# Patient Record
Sex: Female | Born: 1955 | Race: White | Hispanic: No | Marital: Married | Smoking: Never smoker
Health system: Southern US, Community
[De-identification: ages and names within clinical notes are randomized; demographics above are authoritative.]

## PROBLEM LIST (undated history)

## (undated) DIAGNOSIS — R5383 Other fatigue: Secondary | ICD-10-CM

## (undated) DIAGNOSIS — M5020 Other cervical disc displacement, unspecified cervical region: Secondary | ICD-10-CM

## (undated) DIAGNOSIS — D509 Iron deficiency anemia, unspecified: Secondary | ICD-10-CM

## (undated) DIAGNOSIS — E538 Deficiency of other specified B group vitamins: Secondary | ICD-10-CM

## (undated) DIAGNOSIS — G8929 Other chronic pain: Secondary | ICD-10-CM

## (undated) DIAGNOSIS — A6000 Herpesviral infection of urogenital system, unspecified: Secondary | ICD-10-CM

## (undated) DIAGNOSIS — R55 Syncope and collapse: Secondary | ICD-10-CM

## (undated) DIAGNOSIS — J3081 Allergic rhinitis due to animal (cat) (dog) hair and dander: Secondary | ICD-10-CM

## (undated) DIAGNOSIS — K319 Disease of stomach and duodenum, unspecified: Secondary | ICD-10-CM

## (undated) DIAGNOSIS — M2550 Pain in unspecified joint: Secondary | ICD-10-CM

## (undated) DIAGNOSIS — I341 Nonrheumatic mitral (valve) prolapse: Secondary | ICD-10-CM

## (undated) HISTORY — DX: Allergic rhinitis due to animal (cat) (dog) hair and dander: J30.81

## (undated) HISTORY — PX: TONSILLECTOMY AND ADENOIDECTOMY: SUR1326

## (undated) HISTORY — DX: Iron deficiency anemia, unspecified: D50.9

## (undated) HISTORY — DX: Disease of stomach and duodenum, unspecified: K31.9

## (undated) HISTORY — DX: Other chronic pain: G89.29

## (undated) HISTORY — DX: Deficiency of other specified B group vitamins: E53.8

## (undated) HISTORY — DX: Nonrheumatic mitral (valve) prolapse: I34.1

## (undated) HISTORY — DX: Other cervical disc displacement, unspecified cervical region: M50.20

## (undated) HISTORY — DX: Other fatigue: R53.83

## (undated) HISTORY — DX: Herpesviral infection of urogenital system, unspecified: A60.00

## (undated) HISTORY — DX: Syncope and collapse: R55

## (undated) HISTORY — PX: OTHER SURGICAL HISTORY: SHX169

## (undated) HISTORY — DX: Pain in unspecified joint: M25.50

---

## 1990-02-21 HISTORY — PX: ABDOMINAL HYSTERECTOMY: SHX81

## 2008-09-16 ENCOUNTER — Encounter: Admission: RE | Admit: 2008-09-16 | Discharge: 2008-09-16 | Payer: Self-pay | Admitting: Neurology

## 2008-10-23 ENCOUNTER — Ambulatory Visit: Payer: Self-pay | Admitting: Obstetrics & Gynecology

## 2009-01-28 ENCOUNTER — Ambulatory Visit: Payer: Self-pay | Admitting: Internal Medicine

## 2009-04-17 DIAGNOSIS — R55 Syncope and collapse: Secondary | ICD-10-CM

## 2009-04-21 ENCOUNTER — Ambulatory Visit: Payer: Self-pay | Admitting: Cardiovascular Disease

## 2009-04-21 DIAGNOSIS — I059 Rheumatic mitral valve disease, unspecified: Secondary | ICD-10-CM | POA: Insufficient documentation

## 2009-04-27 ENCOUNTER — Encounter: Payer: Self-pay | Admitting: Cardiovascular Disease

## 2009-04-28 ENCOUNTER — Ambulatory Visit: Payer: Self-pay

## 2009-05-05 ENCOUNTER — Telehealth: Payer: Self-pay | Admitting: Cardiovascular Disease

## 2009-07-15 ENCOUNTER — Ambulatory Visit: Payer: Self-pay | Admitting: Internal Medicine

## 2010-03-23 NOTE — Progress Notes (Signed)
Summary: PHI  PHI   Imported By: Harlon Flor 04/23/2009 08:57:41  _____________________________________________________________________  External Attachment:    Type:   Image     Comment:   External Document

## 2010-03-23 NOTE — Procedures (Signed)
Summary: [facility]   Imported By: Harlon Flor 05/12/2009 11:31:10  _____________________________________________________________________  External Attachment:    Type:   Image     Comment:   External Document

## 2010-03-23 NOTE — Progress Notes (Signed)
Summary: [facility]  Phone Note Outgoing Call   Call placed by: Cala Bradford  Call placed to: Patient Summary of Call: called to pt to follow up on monitor pt has sent monitor back to [facility] due to a death in the family. she couldn' t handle everything going on. she said that she has felt ok with no synocope.  pt said that she would follow up with [place] at her apppointment.  I let pt know if she needed [place] to please call.   Initial call taken by: Mercer Pod,  May 05, 2009 9:01 AM
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
## 2010-04-19 ENCOUNTER — Ambulatory Visit: Payer: Self-pay | Admitting: Internal Medicine

## 2010-04-30 ENCOUNTER — Ambulatory Visit: Payer: Self-pay | Admitting: Internal Medicine

## 2010-05-25 ENCOUNTER — Ambulatory Visit: Payer: Self-pay | Admitting: Internal Medicine

## 2010-09-08 ENCOUNTER — Encounter: Payer: Self-pay | Admitting: Cardiovascular Disease

## 2010-09-28 ENCOUNTER — Encounter: Payer: Self-pay | Admitting: Internal Medicine

## 2010-10-04 ENCOUNTER — Encounter: Payer: Self-pay | Admitting: Internal Medicine

## 2010-11-22 ENCOUNTER — Ambulatory Visit (INDEPENDENT_AMBULATORY_CARE_PROVIDER_SITE_OTHER): Payer: BC Managed Care – PPO | Admitting: Internal Medicine

## 2010-11-22 ENCOUNTER — Encounter: Payer: Self-pay | Admitting: Internal Medicine

## 2010-11-22 VITALS — BP 112/71 | HR 58 | Temp 98.0°F | Resp 16 | Ht 62.0 in | Wt 118.0 lb

## 2010-11-22 DIAGNOSIS — J029 Acute pharyngitis, unspecified: Secondary | ICD-10-CM

## 2010-11-22 MED ORDER — AMOXICILLIN 500 MG PO CAPS: 500.0000 mg | ORAL_CAPSULE | Freq: Three times a day (TID) | ORAL | Status: AC

## 2010-11-22 NOTE — Patient Instructions (Signed)
Start antibiotics. Call if symptoms not improved within 48hr.

## 2010-11-22 NOTE — Progress Notes (Signed)
Subjective:    Patient ID: Lauren Whitaker, female    DOB: 05-Jun-1955, 55 y.o.   MRN: [id number]  Sore Throat  This is a new problem. The current episode started in the past 7 days. The problem has been unchanged. Neither side of throat is experiencing more pain than the other. There has been no fever. The pain is moderate. Associated symptoms include abdominal pain and swollen glands. Pertinent negatives include no congestion, coughing, diarrhea, drooling, ear pain, headaches, neck pain, shortness of breath, stridor, trouble swallowing or vomiting. She has had exposure to strep. She has tried acetaminophen and NSAIDs for the symptoms. The treatment provided no relief.     Outpatient Encounter Prescriptions as of 11/22/2010  Medication Sig Dispense Refill  . estradiol (ESTRACE) 2 MG tablet Take 2 mg by mouth daily.       . ferrous sulfate 325 (65 FE) MG EC tablet Take 325 mg by mouth 3 (three) times daily.        . predniSONE (DELTASONE) 5 MG tablet Take 5 mg by mouth daily.        . vitamin B-12 (CYANOCOBALAMIN) 1000 MCG tablet Take 1,000 mcg by mouth daily.          Review of Systems  Constitutional: Positive for chills and fatigue. Negative for fever.  HENT: Positive for sore throat. Negative for ear pain, congestion, drooling, trouble swallowing, neck pain and voice change.   Respiratory: Negative for cough, chest tightness, shortness of breath and stridor.   Cardiovascular: Negative for chest pain.  Gastrointestinal: Positive for abdominal pain. Negative for nausea, vomiting, diarrhea and constipation.  Musculoskeletal: Negative for arthralgias.  Skin: Negative for rash.  Neurological: Negative for headaches.   BP 112/71  Pulse 58  Temp(Src) 98 F (36.7 C) (Oral)  Resp 16  Ht 5\' 2"  (1.575 m)  Wt 118 lb (53.524 kg)  BMI 21.58 kg/m2  SpO2 100%     Objective:   Physical Exam  Constitutional: She is oriented to person, place, and time. She appears well-developed and  well-nourished. No distress.  HENT:  Head: Normocephalic and atraumatic.  Right Ear: Tympanic membrane, external ear and ear canal normal.  Left Ear: Tympanic membrane, external ear and ear canal normal.  Nose: Nose normal.  Mouth/Throat: Posterior oropharyngeal erythema present. No oropharyngeal exudate.  Eyes: Conjunctivae are normal. Pupils are equal, round, and reactive to light. Right eye exhibits no discharge. Left eye exhibits no discharge. No scleral icterus.  Neck: Normal range of motion. Neck supple. No tracheal deviation present. No thyromegaly present.  Cardiovascular: Normal rate, regular rhythm, normal heart sounds and intact distal pulses.  Exam reveals no gallop and no friction rub.   No murmur heard. Pulmonary/Chest: Effort normal and breath sounds normal. No respiratory distress. She has no wheezes. She has no rales. She exhibits no tenderness.  Musculoskeletal: Normal range of motion. She exhibits no edema and no tenderness.  Lymphadenopathy:    She has cervical adenopathy.       Right cervical: Superficial cervical adenopathy present.       Left cervical: Superficial cervical adenopathy present.  Neurological: She is alert and oriented to person, place, and time. No cranial nerve deficit. She exhibits normal muscle tone. Coordination normal.  Skin: Skin is warm and dry. No rash noted. She is not diaphoretic. No erythema. No pallor.  Psychiatric: She has a normal mood and affect. Her behavior is normal. Judgment and thought content normal.  Assessment & Plan:  1. Pharyngitis - History, symptoms and physical exam findings consistent with strep including: sore throat in absence of other symptoms such as cough/congestion, tender cervical LAD, abdominal pain, and exposure to strep.  Will treat empirically with Amoxicillin. Pt will call or return to clinic if symptoms do not improve.

## 2011-01-17 ENCOUNTER — Telehealth: Payer: Self-pay | Admitting: *Deleted

## 2011-01-17 NOTE — Telephone Encounter (Signed)
Pt left VM req antibiotics for "sinus infection". I spoke w/patient and advised her that she would need OV for antibiotics and scheduled for apt tomorrow am.

## 2011-01-18 ENCOUNTER — Ambulatory Visit: Payer: BC Managed Care – PPO | Admitting: Internal Medicine

## 2011-02-28 ENCOUNTER — Ambulatory Visit: Payer: BC Managed Care – PPO | Admitting: Internal Medicine

## 2011-03-07 ENCOUNTER — Encounter: Payer: Self-pay | Admitting: *Deleted

## 2011-03-07 ENCOUNTER — Encounter: Payer: Self-pay | Admitting: Internal Medicine

## 2011-03-07 ENCOUNTER — Ambulatory Visit (INDEPENDENT_AMBULATORY_CARE_PROVIDER_SITE_OTHER): Payer: BC Managed Care – PPO | Admitting: Internal Medicine

## 2011-03-07 VITALS — BP 118/76 | HR 76 | Temp 98.6°F

## 2011-03-07 DIAGNOSIS — Z111 Encounter for screening for respiratory tuberculosis: Secondary | ICD-10-CM

## 2011-03-07 DIAGNOSIS — N390 Urinary tract infection, site not specified: Secondary | ICD-10-CM

## 2011-03-07 LAB — POCT URINALYSIS DIPSTICK
Bilirubin, UA: NEGATIVE
Glucose, UA: NEGATIVE
Spec Grav, UA: 1.025

## 2011-03-07 MED ORDER — CIPROFLOXACIN HCL 500 MG PO TABS: 500.0000 mg | ORAL_TABLET | Freq: Two times a day (BID) | ORAL | Status: AC

## 2011-03-07 NOTE — Progress Notes (Signed)
Addended by: Melody Comas L on: 03/07/2011 11:30 AM   Modules accepted: Orders

## 2011-03-07 NOTE — Progress Notes (Signed)
  Subjective:    Patient ID: Lauren Whitaker, female    DOB: 1955/08/29, 56 y.o.   MRN: [id number]  HPI 56YO female presents for acute visit c/o urinary urgency, frequency, chills x 3 days.  Denies fever, hematuria, flank pain.  Has not taken any medication for this.  Outpatient Encounter Prescriptions as of 03/07/2011  Medication Sig Dispense Refill  . ciprofloxacin (CIPRO) 500 MG tablet Take 1 tablet (500 mg total) by mouth 2 (two) times daily.  20 tablet  0  . estradiol (ESTRACE) 2 MG tablet Take 2 mg by mouth daily.       . ferrous sulfate 325 (65 FE) MG EC tablet Take 325 mg by mouth 3 (three) times daily.        . predniSONE (DELTASONE) 5 MG tablet Take 5 mg by mouth daily.        . vitamin B-12 (CYANOCOBALAMIN) 1000 MCG tablet Take 1,000 mcg by mouth daily.          Review of Systems  Constitutional: Positive for chills. Negative for fever and fatigue.  Respiratory: Negative for cough and shortness of breath.   Genitourinary: Positive for dysuria, urgency and frequency. Negative for hematuria.   BP 118/76  Pulse 76  Temp 98.6 F (37 C)     Objective:   Physical Exam  Constitutional: She is oriented to person, place, and time. She appears well-developed and well-nourished. No distress.  HENT:  Head: Normocephalic and atraumatic.  Right Ear: External ear normal.  Left Ear: External ear normal.  Nose: Nose normal.  Mouth/Throat: Oropharynx is clear and moist. No oropharyngeal exudate.  Eyes: Conjunctivae are normal. Pupils are equal, round, and reactive to light. Right eye exhibits no discharge. Left eye exhibits no discharge. No scleral icterus.  Neck: Normal range of motion. Neck supple. No tracheal deviation present. No thyromegaly present.  Cardiovascular: Normal rate, regular rhythm, normal heart sounds and intact distal pulses.  Exam reveals no gallop and no friction rub.   No murmur heard. Pulmonary/Chest: Effort normal and breath sounds normal. No respiratory  distress. She has no wheezes. She has no rales. She exhibits no tenderness.  Abdominal: There is CVA tenderness (left).  Musculoskeletal: Normal range of motion. She exhibits no edema and no tenderness.  Lymphadenopathy:    She has no cervical adenopathy.  Neurological: She is alert and oriented to person, place, and time. No cranial nerve deficit. She exhibits normal muscle tone. Coordination normal.  Skin: Skin is warm and dry. No rash noted. She is not diaphoretic. No erythema. No pallor.  Psychiatric: She has a normal mood and affect. Her behavior is normal. Judgment and thought content normal.          Assessment & Plan:  1. Urinary tract infection - Urine dip is negative, however pt symptoms suggestive of UTI. Will send urine for culture and treat empirically with cipro until culture back. Pt will call if symptoms not improving.

## 2011-03-09 LAB — TB SKIN TEST: Induration: 0

## 2011-03-09 LAB — URINE CULTURE
Colony Count: NO GROWTH
Organism ID, Bacteria: NO GROWTH

## 2011-03-30 ENCOUNTER — Encounter: Payer: Self-pay | Admitting: Internal Medicine

## 2011-08-04 ENCOUNTER — Other Ambulatory Visit: Payer: Self-pay | Admitting: Internal Medicine

## 2011-09-20 ENCOUNTER — Other Ambulatory Visit: Payer: Self-pay | Admitting: Internal Medicine

## 2011-10-07 ENCOUNTER — Telehealth: Payer: Self-pay | Admitting: Internal Medicine

## 2011-10-07 NOTE — Telephone Encounter (Signed)
Patient was sent to triage nurse. We will receive call from them.

## 2011-10-07 NOTE — Telephone Encounter (Signed)
Patient calling, she accidently took her mom's Synthroid last week on Monday, Tuesday and Wednesday, 8/5-8/7.  Once she realized what she had done she stopped immediately.   She is now having right shoulder pain and discomfort.  Saw MD on Thursday but didn't share this with him.  Asking if the Synthoid could be causing the pain in her shoulder.   Transferred to [employer].   She states that the symptoms are not related.

## 2011-10-07 NOTE — Telephone Encounter (Signed)
Pt called back wanting to speak to a nurse.  Pt stated the message was to long to leave a phone note Sent to triage nurse

## 2011-10-07 NOTE — Telephone Encounter (Signed)
Wants a return call from a nurse did not elaborate on what she wanted. I tried to question her as to the reason for the call and she would not tell me.

## 2011-10-07 NOTE — Telephone Encounter (Signed)
Spoke with patient and she was happy with experience with CALL A NURSE and she stated that she feels much better now.

## 2011-10-10 ENCOUNTER — Encounter: Payer: Self-pay | Admitting: Internal Medicine

## 2011-10-10 ENCOUNTER — Ambulatory Visit (INDEPENDENT_AMBULATORY_CARE_PROVIDER_SITE_OTHER): Payer: BC Managed Care – PPO | Admitting: Internal Medicine

## 2011-10-10 ENCOUNTER — Ambulatory Visit (INDEPENDENT_AMBULATORY_CARE_PROVIDER_SITE_OTHER)
Admission: RE | Admit: 2011-10-10 | Discharge: 2011-10-10 | Disposition: A | Discharge status: Home / Self Care | Payer: BC Managed Care – PPO | Source: Ambulatory Visit | Attending: Internal Medicine | Admitting: Internal Medicine

## 2011-10-10 VITALS — BP 120/80 | HR 77 | Temp 98.6°F | Ht 62.0 in | Wt 116.5 lb

## 2011-10-10 DIAGNOSIS — M546 Pain in thoracic spine: Secondary | ICD-10-CM

## 2011-10-10 DIAGNOSIS — R52 Pain, unspecified: Secondary | ICD-10-CM

## 2011-10-10 DIAGNOSIS — R1032 Left lower quadrant pain: Secondary | ICD-10-CM

## 2011-10-10 LAB — POCT URINALYSIS DIPSTICK
Glucose, UA: NEGATIVE
Ketones, UA: NEGATIVE
Spec Grav, UA: 1.01
Urobilinogen, UA: 0.2

## 2011-10-10 NOTE — Assessment & Plan Note (Signed)
Unclear etiology of right upper back pain. Exam is normal. Chest x-ray performed today was normal. Question if this might be referred pain from her right upper abdomen and cholecystitis. Will check labs including CMP today. Will schedule CT scan of the abdomen to further evaluate for cholecystitis and her diverticulitis. For now, symptoms are controlled without medication. Will continue to monitor. Followup later this week.

## 2011-10-10 NOTE — Assessment & Plan Note (Signed)
Symptoms are concerning for diverticulitis. Will get CT abdomen for further evaluation. Will check CBC, CMP, and lipase with labs today.

## 2011-10-10 NOTE — Progress Notes (Signed)
Subjective:    Patient ID: Lauren Whitaker, female    DOB: 04-24-1955, 56 y.o.   MRN: [id number]  HPI 56 year old female presents for acute visit complaining of several week history of right upper back pain. She reports that this began a few weeks ago with pain in her right upper shoulder blade. She denies any known injury to her upper back or shoulder. The pain is described as aching. There is no associated cough, shortness of breath, fever, or chills. She ultimately went to see a chiropractor who performed some manipulations with no improvement in her symptoms. During this same time, she notes she has been having some nausea and occasional left lower abdominal pain. She questions whether the right upper back pain may be referred pain from gallbladder issue. However pain and nausea do not necessarily coincide. The pain in her abdomen is also described as aching pain in the left lower quadrand. She has chronic issues with constipation but these are generally been controlled with laxatives. She denies any blood in her stool, black stool, vomiting, diarrhea. She denies change in appetite. She denies fever or chills.  Outpatient Encounter Prescriptions as of 10/10/2011  Medication Sig Dispense Refill  . estradiol (ESTRACE) 2 MG tablet TAKE 1 TABLET BY MOUTH EVERY DAY  30 tablet  7  . predniSONE (DELTASONE) 5 MG tablet TAKE 1 TABLET BY MOUTH DAILY  30 tablet  2  . DISCONTD: ferrous sulfate 325 (65 FE) MG EC tablet Take 325 mg by mouth 3 (three) times daily.        Marland Kitchen DISCONTD: vitamin B-12 (CYANOCOBALAMIN) 1000 MCG tablet Take 1,000 mcg by mouth daily.         BP 120/80  Pulse 77  Temp 98.6 F (37 C) (Oral)  Ht 5\' 2"  (1.575 m)  Wt 116 lb 8 oz (52.844 kg)  BMI 21.31 kg/m2  SpO2 98%  Review of Systems  Constitutional: Negative for fever, chills, appetite change, fatigue and unexpected weight change.  HENT: Negative for ear pain, congestion, sore throat, trouble swallowing, neck pain, voice change  and sinus pressure.   Eyes: Negative for visual disturbance.  Respiratory: Negative for cough, shortness of breath, wheezing and stridor.   Cardiovascular: Negative for chest pain, palpitations and leg swelling.  Gastrointestinal: Positive for nausea, abdominal pain and constipation. Negative for vomiting, diarrhea, blood in stool, abdominal distention and anal bleeding.  Genitourinary: Negative for dysuria and flank pain.  Musculoskeletal: Positive for myalgias, back pain and arthralgias. Negative for gait problem.  Skin: Negative for color change and rash.  Neurological: Negative for dizziness and headaches.  Hematological: Negative for adenopathy. Does not bruise/bleed easily.  Psychiatric/Behavioral: Negative for suicidal ideas, disturbed wake/sleep cycle and dysphoric mood. The patient is not nervous/anxious.        Objective:   Physical Exam  Constitutional: She is oriented to person, place, and time. She appears well-developed and well-nourished. No distress.  HENT:  Head: Normocephalic and atraumatic.  Right Ear: External ear normal.  Left Ear: External ear normal.  Nose: Nose normal.  Mouth/Throat: Oropharynx is clear and moist. No oropharyngeal exudate.  Eyes: Conjunctivae are normal. Pupils are equal, round, and reactive to light. Right eye exhibits no discharge. Left eye exhibits no discharge. No scleral icterus.  Neck: Normal range of motion. Neck supple. No tracheal deviation present. No thyromegaly present.  Cardiovascular: Normal rate, regular rhythm, normal heart sounds and intact distal pulses.  Exam reveals no gallop and no friction rub.   No murmur  heard. Pulmonary/Chest: Effort normal and breath sounds normal. No respiratory distress. She has no wheezes. She has no rales.   She exhibits no tenderness.  Abdominal: Soft. Bowel sounds are normal. She exhibits no distension and no mass. There is tenderness (RUQ and LLQ). There is no rebound and no guarding.    Musculoskeletal: Normal range of motion. She exhibits no edema and no tenderness.  Lymphadenopathy:    She has no cervical adenopathy.  Neurological: She is alert and oriented to person, place, and time. No cranial nerve deficit. She exhibits normal muscle tone. Coordination normal.  Skin: Skin is warm and dry. No rash noted. She is not diaphoretic. No erythema. No pallor.  Psychiatric: She has a normal mood and affect. Her behavior is normal. Judgment and thought content normal.          Assessment & Plan:

## 2011-10-11 ENCOUNTER — Ambulatory Visit: Payer: Self-pay | Admitting: Internal Medicine

## 2011-10-11 ENCOUNTER — Encounter: Payer: Self-pay | Admitting: Internal Medicine

## 2011-10-11 ENCOUNTER — Other Ambulatory Visit: Payer: Self-pay | Admitting: *Deleted

## 2011-10-11 DIAGNOSIS — M549 Dorsalgia, unspecified: Secondary | ICD-10-CM

## 2011-10-11 LAB — CBC WITH DIFFERENTIAL/PLATELET
Basophils Absolute: 0.1 10*3/uL (ref 0.0–0.1)
Eosinophils Relative: 0.7 % (ref 0.0–5.0)
Monocytes Absolute: 0.6 10*3/uL (ref 0.1–1.0)
Monocytes Relative: 6.5 % (ref 3.0–12.0)
Neutrophils Relative %: 60.1 % (ref 43.0–77.0)
Platelets: 224 10*3/uL (ref 150.0–400.0)
WBC: 9.3 10*3/uL (ref 4.5–10.5)

## 2011-10-11 LAB — COMPREHENSIVE METABOLIC PANEL
ALT: 16 U/L (ref 0–35)
Albumin: 3.7 g/dL (ref 3.5–5.2)
Alkaline Phosphatase: 31 U/L — ABNORMAL LOW (ref 39–117)
CO2: 25 mEq/L (ref 19–32)
Glucose, Bld: 70 mg/dL (ref 70–99)
Potassium: 3.6 mEq/L (ref 3.5–5.1)
Sodium: 136 mEq/L (ref 135–145)
Total Protein: 6.5 g/dL (ref 6.0–8.3)

## 2011-10-11 LAB — LIPASE: Lipase: 31 U/L (ref 11.0–59.0)

## 2011-10-11 MED ORDER — SULFAMETHOXAZOLE-TRIMETHOPRIM 800-160 MG PO TABS: 1.0000 | ORAL_TABLET | Freq: Two times a day (BID) | ORAL | Status: AC

## 2011-10-12 ENCOUNTER — Telehealth: Payer: Self-pay | Admitting: Internal Medicine

## 2011-10-12 ENCOUNTER — Encounter: Payer: Self-pay | Admitting: Internal Medicine

## 2011-10-12 DIAGNOSIS — K529 Noninfective gastroenteritis and colitis, unspecified: Secondary | ICD-10-CM

## 2011-10-12 LAB — URINE CULTURE: Colony Count: >=100000

## 2011-10-12 MED ORDER — METRONIDAZOLE 500 MG PO TABS: 500.0000 mg | ORAL_TABLET | Freq: Three times a day (TID) | ORAL | Status: AC

## 2011-10-12 MED ORDER — CIPROFLOXACIN HCL 500 MG PO TABS: 500.0000 mg | ORAL_TABLET | Freq: Two times a day (BID) | ORAL | Status: AC

## 2011-10-12 NOTE — Telephone Encounter (Signed)
CT abdomen 8/20 showed thickened sigmoid and left colon wall consistent with colitis.

## 2011-10-14 ENCOUNTER — Encounter: Payer: Self-pay | Admitting: Internal Medicine

## 2011-10-17 ENCOUNTER — Encounter: Payer: Self-pay | Admitting: Internal Medicine

## 2011-10-24 ENCOUNTER — Encounter: Payer: Self-pay | Admitting: Internal Medicine

## 2011-10-25 ENCOUNTER — Other Ambulatory Visit: Payer: Self-pay | Admitting: *Deleted

## 2011-10-25 MED ORDER — VALACYCLOVIR HCL 500 MG PO TABS: 500.0000 mg | ORAL_TABLET | Freq: Two times a day (BID) | ORAL | Status: DC

## 2011-10-25 NOTE — Telephone Encounter (Signed)
Left message on cell phone voicemail for patient that Rx is ready for pick up will be left at front desk.

## 2011-10-26 ENCOUNTER — Encounter: Payer: Self-pay | Admitting: Internal Medicine

## 2011-10-26 MED ORDER — FLUCONAZOLE 150 MG PO TABS: 150.0000 mg | ORAL_TABLET | Freq: Once | ORAL | Status: DC

## 2011-11-14 ENCOUNTER — Encounter: Payer: Self-pay | Admitting: Internal Medicine

## 2011-11-15 ENCOUNTER — Ambulatory Visit: Payer: BC Managed Care – PPO | Admitting: Internal Medicine

## 2011-12-09 ENCOUNTER — Encounter: Payer: Self-pay | Admitting: Internal Medicine

## 2011-12-13 ENCOUNTER — Ambulatory Visit: Payer: BC Managed Care – PPO | Admitting: Internal Medicine

## 2011-12-14 ENCOUNTER — Other Ambulatory Visit: Payer: Self-pay | Admitting: Internal Medicine

## 2012-03-25 ENCOUNTER — Other Ambulatory Visit: Payer: Self-pay | Admitting: Internal Medicine

## 2012-04-03 ENCOUNTER — Ambulatory Visit: Payer: BC Managed Care – PPO

## 2012-04-07 ENCOUNTER — Other Ambulatory Visit: Payer: Self-pay

## 2012-04-09 ENCOUNTER — Encounter: Payer: Self-pay | Admitting: Internal Medicine

## 2012-04-25 ENCOUNTER — Other Ambulatory Visit: Payer: Self-pay | Admitting: Internal Medicine

## 2012-07-04 ENCOUNTER — Other Ambulatory Visit: Payer: Self-pay | Admitting: *Deleted

## 2012-07-04 ENCOUNTER — Other Ambulatory Visit: Payer: Self-pay | Admitting: Internal Medicine

## 2012-07-04 MED ORDER — PREDNISONE 5 MG PO TABS: ORAL_TABLET | ORAL | Status: DC

## 2012-07-04 NOTE — Telephone Encounter (Signed)
Ok to refill 

## 2012-07-04 NOTE — Telephone Encounter (Signed)
Eprescribed.

## 2012-08-08 ENCOUNTER — Other Ambulatory Visit: Payer: Self-pay | Admitting: Internal Medicine

## 2012-08-08 NOTE — Telephone Encounter (Signed)
Okay to refill? No recent appointments-please advise

## 2012-08-08 NOTE — Telephone Encounter (Signed)
Fine to fill. Needs to be seen at least 1x per year.

## 2012-08-08 NOTE — Telephone Encounter (Signed)
ESCRIBED

## 2012-08-23 ENCOUNTER — Telehealth: Payer: Self-pay | Admitting: Internal Medicine

## 2012-08-23 MED ORDER — FLUCONAZOLE 150 MG PO TABS: 150.0000 mg | ORAL_TABLET | Freq: Once | ORAL | Status: AC

## 2012-08-23 NOTE — Telephone Encounter (Signed)
The patient had dental work done and was put on antibiotics now she has developed a yeast infection. She wants a prescription for Diflucan called into the pharmacy.

## 2012-08-23 NOTE — Telephone Encounter (Signed)
Fine to call in Diflucan 150mg po x 1 

## 2012-08-23 NOTE — Telephone Encounter (Signed)
Rx sent to pharmacy on file.

## 2012-08-23 NOTE — Telephone Encounter (Signed)
Please read below...

## 2012-09-26 IMAGING — CT CT ABD-PELV W/ CM
1 of 3 series · 15 of 32 positions shown, 20 images · non-contrast
Comparison: none

REASON FOR EXAM: Nausea LLQ abd pain severe Eval for Diverticulitis
COMMENTS:

PROCEDURE:     KCT - KCT ABDOMEN/PELVIS W  - October 11, 2011  [DATE]
RESULT:     History: Left lower quadrant pain.
Comparison Study: No recent.

[Series 2: abd 3mm w 3.0 i40f 3 · axial · 0.82mm/px · z∈[-1070,-692]mm · 15 of 140 slices shown, 20 images]
[im 7/140  soft-tissue]
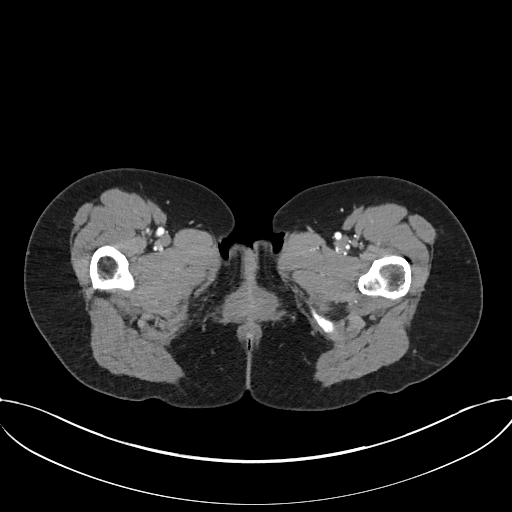
[im 7/140  bone]
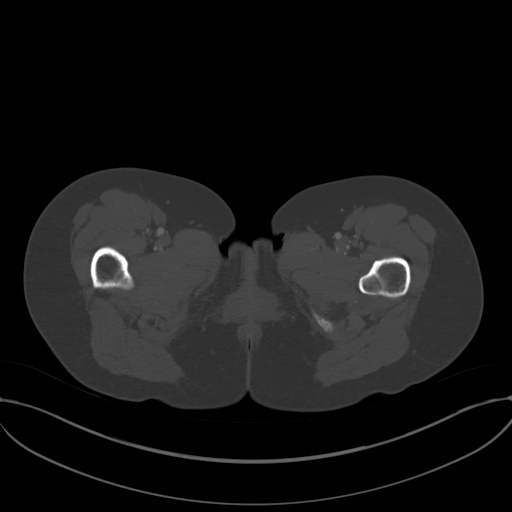
[im 21/140  soft-tissue]
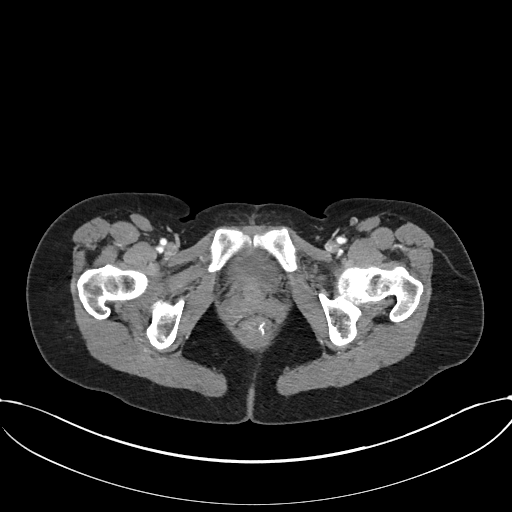
[im 28/140  soft-tissue]
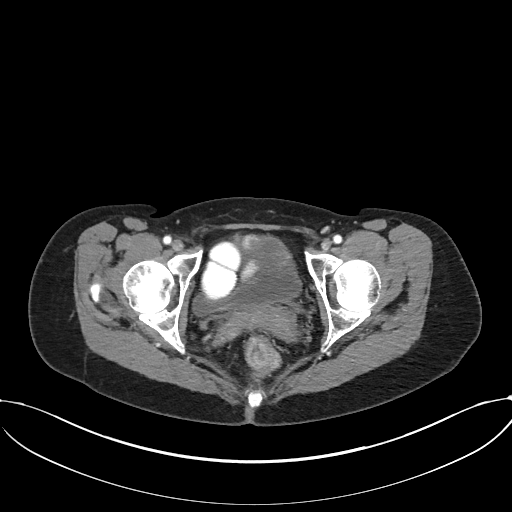
[im 35/140  soft-tissue]
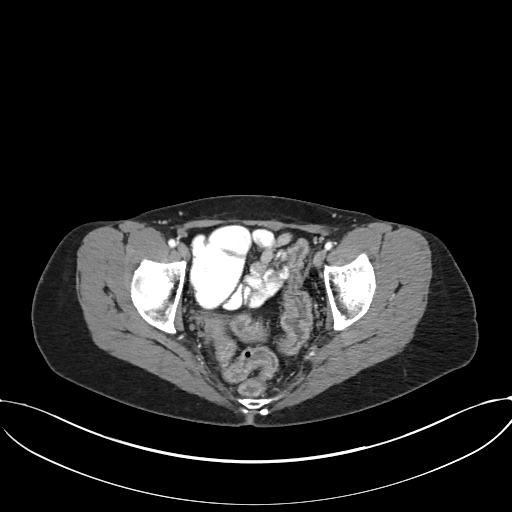
[im 49/140  soft-tissue]
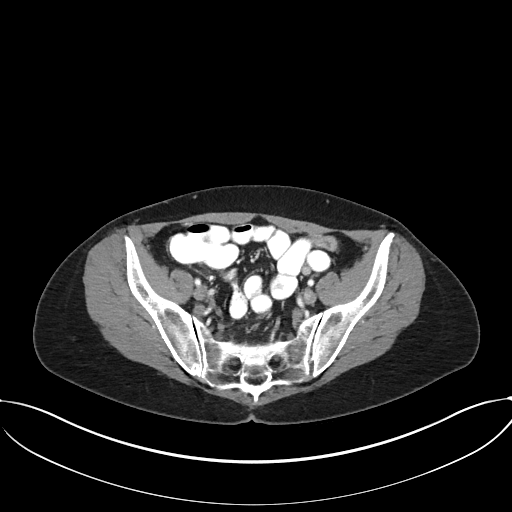
[im 56/140  soft-tissue]
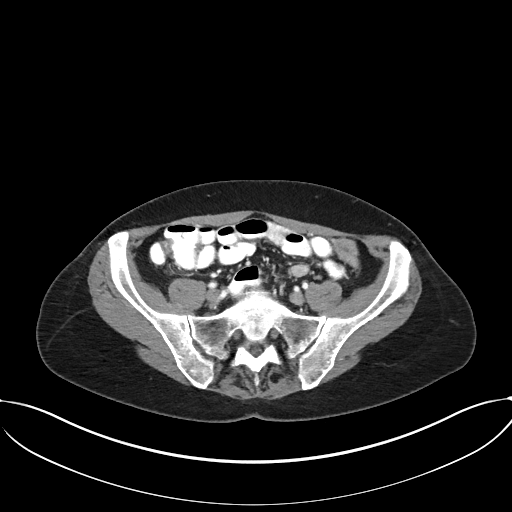
[im 63/140  soft-tissue]
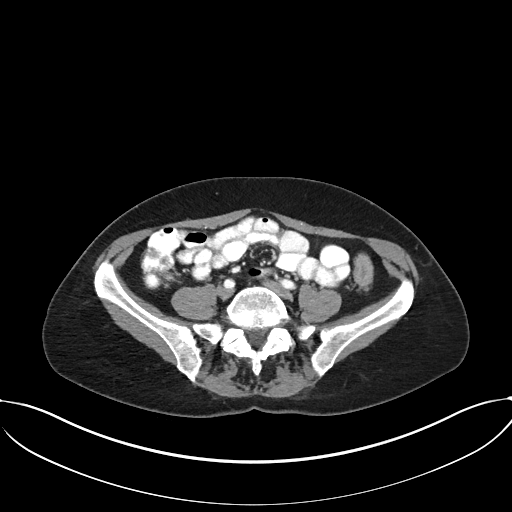
[im 77/140  soft-tissue]
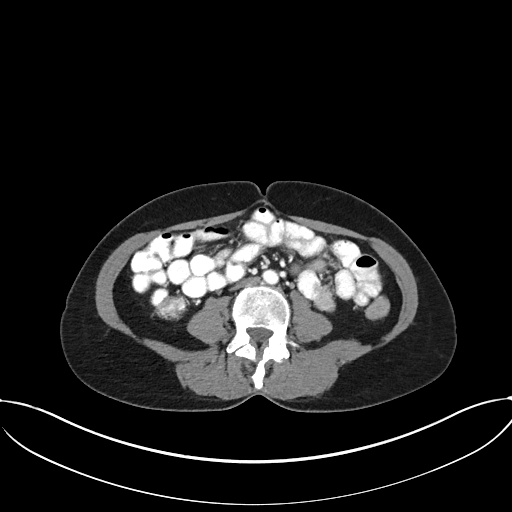
[im 84/140  soft-tissue]
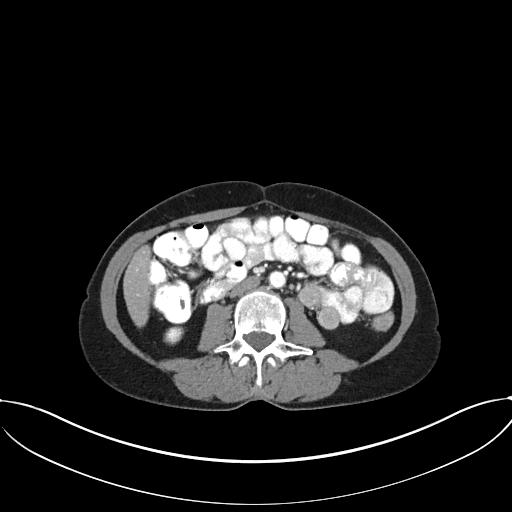
[im 84/140  bone]
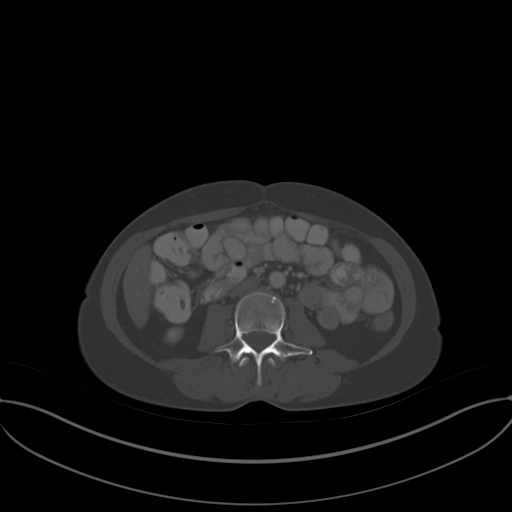
[im 91/140  soft-tissue]
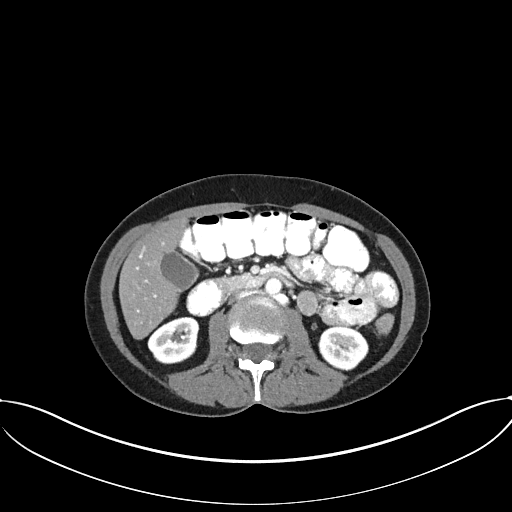
[im 105/140  soft-tissue]
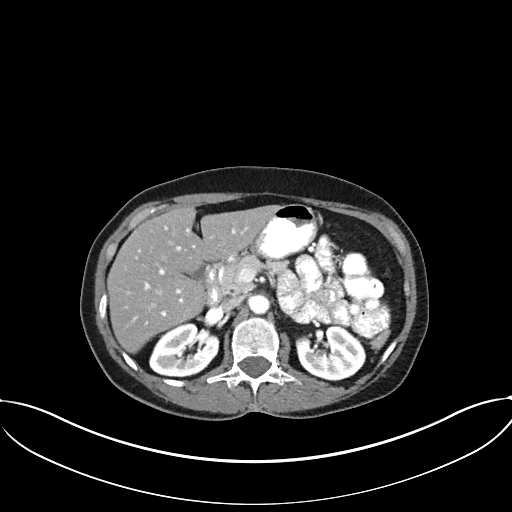
[im 112/140  soft-tissue]
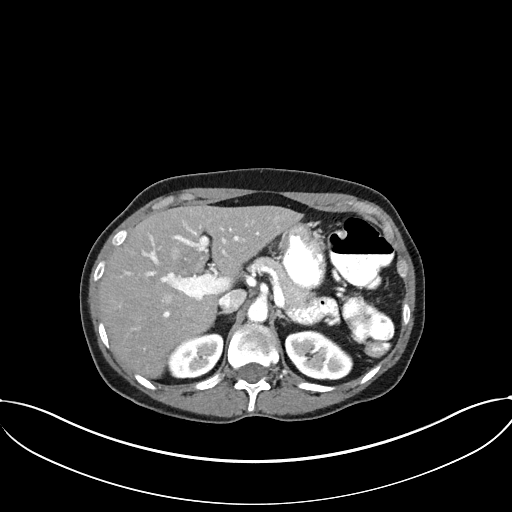
[im 112/140  lung]
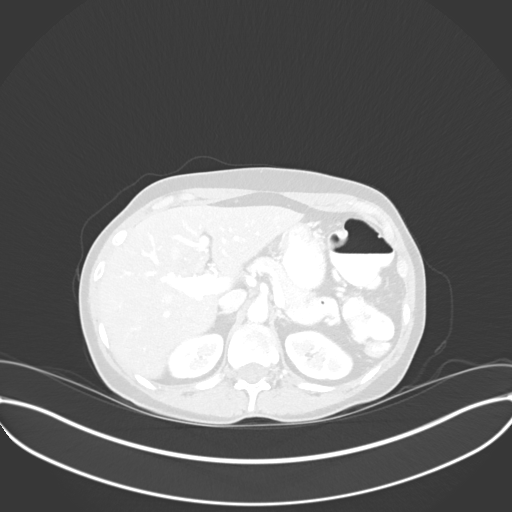
[im 119/140  soft-tissue]
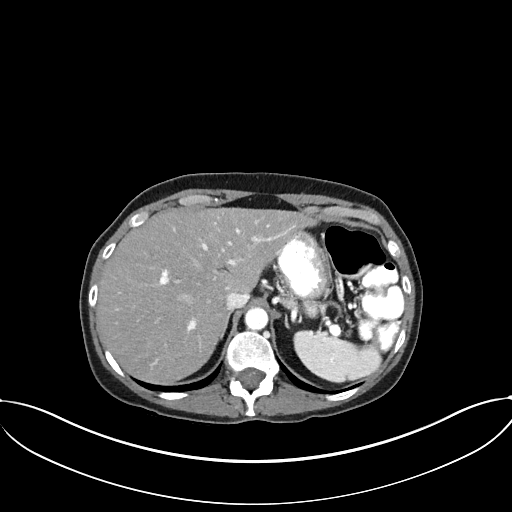
[im 119/140  lung]
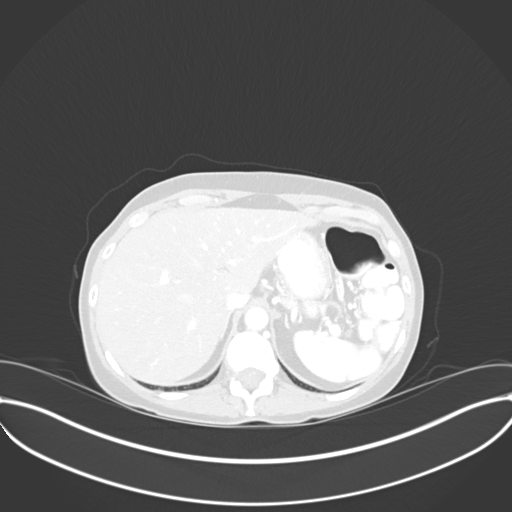
[im 126/140  lung]
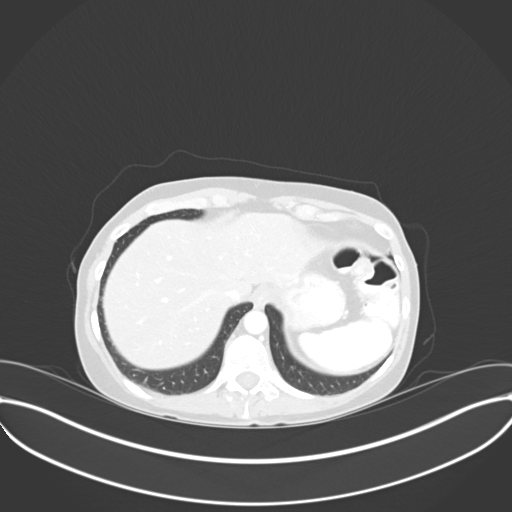
[im 133/140  soft-tissue]
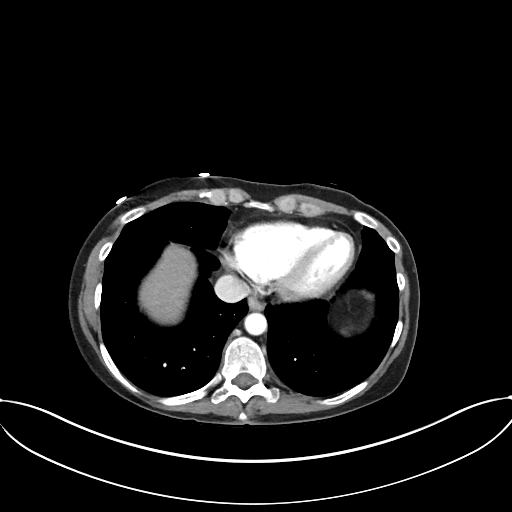
[im 133/140  lung]
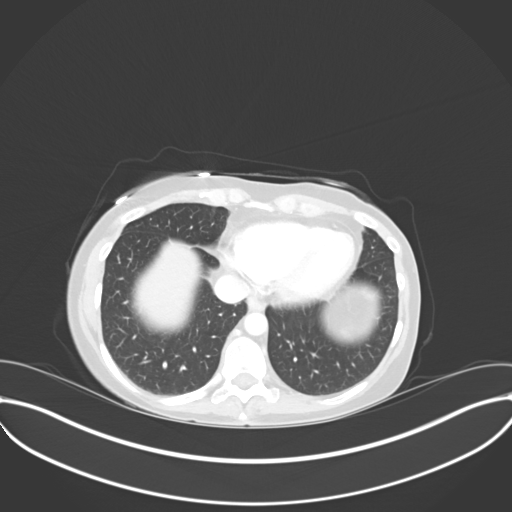

[15 of 32 positions shown; findings below may reference images not displayed]

FINDINGS: Standard CT obtained 85 cc of [id number]. Liver normal. Small
lucency no the spleen this could represent a tiny hemangioma. Pancreas
normal. Adrenals normal. Kidneys unremarkable. Aorta nondistended. Appendix
normal. Bladder nondistended. No free pelvic fluid noted. There is sigmoid
and left colonic wall thickening . Colitis could present in this fashion.
Lung bases clear. No free air.
IMPRESSION: Cannot exclude sigmoid and left colitis.

## 2012-10-12 ENCOUNTER — Encounter: Payer: Self-pay | Admitting: Internal Medicine

## 2012-10-12 ENCOUNTER — Other Ambulatory Visit: Payer: Self-pay | Admitting: Internal Medicine

## 2012-10-12 NOTE — Telephone Encounter (Signed)
We can refill x 1 month, however she needs a follow up visit for additional refills.

## 2012-10-15 ENCOUNTER — Telehealth: Payer: Self-pay | Admitting: Neurology

## 2012-10-15 ENCOUNTER — Telehealth: Payer: Self-pay | Admitting: Internal Medicine
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
## 2012-10-15 NOTE — Telephone Encounter (Signed)
Needs appointment

## 2012-10-15 NOTE — Telephone Encounter (Signed)
Called patient and let her know she needs to see her primary and be referred . And once she gets that done then we can see her.

## 2012-10-16 ENCOUNTER — Encounter: Payer: Self-pay | Admitting: Internal Medicine

## 2012-10-16 DIAGNOSIS — M545 Low back pain: Secondary | ICD-10-CM

## 2012-10-16 NOTE — Telephone Encounter (Signed)
Appointment scheduled for patient on 8.29.14

## 2012-10-17 ENCOUNTER — Encounter: Payer: Self-pay | Admitting: Internal Medicine

## 2012-10-17 ENCOUNTER — Ambulatory Visit (INDEPENDENT_AMBULATORY_CARE_PROVIDER_SITE_OTHER): Payer: Self-pay | Admitting: Internal Medicine

## 2012-10-17 VITALS — BP 102/68 | HR 77 | Temp 98.6°F | Wt 117.0 lb

## 2012-10-17 DIAGNOSIS — F4321 Adjustment disorder with depressed mood: Secondary | ICD-10-CM

## 2012-10-17 DIAGNOSIS — M542 Cervicalgia: Secondary | ICD-10-CM | POA: Insufficient documentation

## 2012-10-17 DIAGNOSIS — M545 Low back pain: Secondary | ICD-10-CM | POA: Insufficient documentation

## 2012-10-17 DIAGNOSIS — Z733 Stress, not elsewhere classified: Secondary | ICD-10-CM

## 2012-10-17 DIAGNOSIS — Z634 Disappearance and death of family member: Secondary | ICD-10-CM | POA: Insufficient documentation

## 2012-10-17 MED ORDER — VALACYCLOVIR HCL 500 MG PO TABS: 500.0000 mg | ORAL_TABLET | Freq: Two times a day (BID) | ORAL | Status: DC

## 2012-10-17 MED ORDER — PREDNISONE 5 MG PO TABS: 2.5000 mg | ORAL_TABLET | Freq: Every day | ORAL | Status: DC
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
## 2012-10-17 NOTE — Assessment & Plan Note (Signed)
Offered support today. We'll continue to monitor.

## 2012-10-18 ENCOUNTER — Encounter: Payer: Self-pay | Admitting: Internal Medicine

## 2012-10-18 ENCOUNTER — Ambulatory Visit (INDEPENDENT_AMBULATORY_CARE_PROVIDER_SITE_OTHER)
Admission: RE | Admit: 2012-10-18 | Discharge: 2012-10-18 | Disposition: A | Discharge status: Home / Self Care | Payer: Self-pay | Source: Ambulatory Visit | Attending: Internal Medicine | Admitting: Internal Medicine

## 2012-10-18 DIAGNOSIS — M542 Cervicalgia: Secondary | ICD-10-CM

## 2012-10-18 DIAGNOSIS — M545 Low back pain: Secondary | ICD-10-CM

## 2012-10-19 ENCOUNTER — Ambulatory Visit: Payer: BC Managed Care – PPO | Admitting: Internal Medicine

## 2012-10-24 ENCOUNTER — Encounter: Payer: Self-pay | Admitting: Internal Medicine

## 2012-10-24 ENCOUNTER — Ambulatory Visit
Admission: RE | Admit: 2012-10-24 | Discharge: 2012-10-24 | Disposition: A | Discharge status: Home / Self Care | Payer: Self-pay | Source: Ambulatory Visit | Attending: Internal Medicine | Admitting: Internal Medicine

## 2012-10-24 ENCOUNTER — Telehealth: Payer: Self-pay | Admitting: *Deleted

## 2012-10-24 DIAGNOSIS — M545 Low back pain: Secondary | ICD-10-CM

## 2012-10-24 DIAGNOSIS — M542 Cervicalgia: Secondary | ICD-10-CM
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
## 2012-10-24 NOTE — Telephone Encounter (Signed)
We have to get approval for each one independently. I have now placed order for MRI lumbar spine.

## 2012-10-24 NOTE — Telephone Encounter (Signed)
Patient on her way for her MRI right now. She is wondering about orders for her plain film xrays.

## 2012-10-25 NOTE — Telephone Encounter (Signed)
Patient responded via Mychart.

## 2012-10-31 ENCOUNTER — Ambulatory Visit (INDEPENDENT_AMBULATORY_CARE_PROVIDER_SITE_OTHER): Payer: Self-pay | Admitting: Internal Medicine

## 2012-10-31 ENCOUNTER — Encounter: Payer: Self-pay | Admitting: Internal Medicine

## 2012-10-31 VITALS — BP 122/70 | HR 72 | Temp 98.2°F | Wt 120.0 lb

## 2012-10-31 DIAGNOSIS — M542 Cervicalgia: Secondary | ICD-10-CM

## 2012-10-31 DIAGNOSIS — M545 Low back pain: Secondary | ICD-10-CM
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
## 2012-10-31 NOTE — Assessment & Plan Note (Signed)
As noted, will pursue evaluation at the [facility] [place]. Patient will call if symptoms are worsening and she wants to try alternative pain medication.

## 2012-10-31 NOTE — Assessment & Plan Note (Signed)
Reviewed recent MRI findings with cervical spondylosis. Encouraged patient to follow through with referral to the [facility] in [place] as she had suggested. We have discussed potential use of medications to help with symptoms that she has been intolerant to pain medicine. She will call if symptoms are worsening.

## 2012-11-05 ENCOUNTER — Encounter: Payer: Self-pay | Admitting: Internal Medicine

## 2012-11-06 ENCOUNTER — Encounter: Payer: Self-pay | Admitting: Internal Medicine

## 2012-11-09 ENCOUNTER — Ambulatory Visit: Payer: BC Managed Care – PPO | Admitting: Internal Medicine

## 2012-11-09 ENCOUNTER — Telehealth: Payer: Self-pay | Admitting: Internal Medicine

## 2012-11-15 ENCOUNTER — Other Ambulatory Visit: Payer: Self-pay | Admitting: Internal Medicine

## 2012-11-19 ENCOUNTER — Encounter: Payer: Self-pay | Admitting: Internal Medicine

## 2012-11-29 NOTE — Telephone Encounter (Signed)
PT NOT BILLED SAME DAY CX FEE °

## 2012-12-05 ENCOUNTER — Encounter: Payer: Self-pay | Admitting: Internal Medicine

## 2012-12-05 MED ORDER — FLUCONAZOLE 150 MG PO TABS: 150.0000 mg | ORAL_TABLET | Freq: Once | ORAL | Status: DC

## 2012-12-10 ENCOUNTER — Encounter: Payer: Self-pay | Admitting: Internal Medicine

## 2012-12-11 ENCOUNTER — Ambulatory Visit: Payer: Self-pay | Admitting: Internal Medicine

## 2012-12-11 ENCOUNTER — Telehealth: Payer: Self-pay | Admitting: Internal Medicine

## 2012-12-11 NOTE — Telephone Encounter (Signed)
No charge since pt rescheduled

## 2012-12-27 ENCOUNTER — Other Ambulatory Visit: Payer: Self-pay

## 2013-01-08 ENCOUNTER — Encounter: Payer: Self-pay | Admitting: Internal Medicine

## 2013-01-11 ENCOUNTER — Ambulatory Visit: Payer: PRIVATE HEALTH INSURANCE | Admitting: Internal Medicine

## 2013-01-14 ENCOUNTER — Other Ambulatory Visit: Payer: Self-pay | Admitting: Internal Medicine

## 2013-01-15 ENCOUNTER — Other Ambulatory Visit: Payer: Self-pay | Admitting: Internal Medicine

## 2013-01-18 ENCOUNTER — Other Ambulatory Visit: Payer: Self-pay | Admitting: Internal Medicine

## 2013-01-18 NOTE — Telephone Encounter (Signed)
I spoke to pt.  She is now taking 5mg  or prednisone 1/2 tablet per day.  Is in the process of a slow taper.  Can call in prednisone 5mg  1/2 tablet q day #15 with no refills.  Pt instructed to keep f/u appt with Dr Dan Humphreys.

## 2013-01-30 ENCOUNTER — Ambulatory Visit: Payer: PRIVATE HEALTH INSURANCE | Admitting: Internal Medicine

## 2013-01-31 ENCOUNTER — Ambulatory Visit: Payer: PRIVATE HEALTH INSURANCE | Admitting: Internal Medicine

## 2013-02-05 ENCOUNTER — Encounter: Payer: Self-pay | Admitting: Internal Medicine

## 2013-02-11 ENCOUNTER — Ambulatory Visit (INDEPENDENT_AMBULATORY_CARE_PROVIDER_SITE_OTHER): Payer: Self-pay

## 2013-02-11 ENCOUNTER — Encounter: Payer: Self-pay | Admitting: Internal Medicine

## 2013-02-11 VITALS — BP 122/80 | HR 79 | Wt 118.0 lb

## 2013-02-11 DIAGNOSIS — L03116 Cellulitis of left lower limb: Secondary | ICD-10-CM | POA: Insufficient documentation

## 2013-02-11 DIAGNOSIS — M2550 Pain in unspecified joint: Secondary | ICD-10-CM | POA: Insufficient documentation

## 2013-02-11 DIAGNOSIS — L02419 Cutaneous abscess of limb, unspecified: Secondary | ICD-10-CM

## 2013-02-11 DIAGNOSIS — Z23 Encounter for immunization: Secondary | ICD-10-CM

## 2013-02-11 MED ORDER — DOXYCYCLINE HYCLATE 100 MG PO TABS: 100.0000 mg | ORAL_TABLET | Freq: Two times a day (BID) | ORAL | Status: DC

## 2013-02-11 MED ORDER — FLUCONAZOLE 150 MG PO TABS: 150.0000 mg | ORAL_TABLET | Freq: Once | ORAL | Status: DC

## 2013-02-11 NOTE — Assessment & Plan Note (Signed)
Exam is consistent with cellulitis and abscess of the left leg. Will start doxycycline twice daily. Nasal culture sent today to look for MRSA colonization. We'll plan to recheck in 2-4 weeks or sooner as needed.

## 2013-02-11 NOTE — Progress Notes (Signed)
   Subjective:    Patient ID: Lauren Whitaker, female    DOB: 04-21-1955, 57 y.o.   MRN: [id number]  HPI 57YO female with h/o chronic arthralgia on chronic prednisone presents for acute visit. Complains of  2 weeks, lesion left posterior thigh, red, painful. No fever, chills. No known h/o MRSA, however works in [employer], [employer]. No previous h/o boils. Lesion has been decreasing in size over last few days.  She is also trying to taper her prednisone dose. Has tapered to 2.5mg  daily, but any further attempt at taper results in increased diffuse arthralgia/myalgia.  Outpatient Encounter Prescriptions as of 02/11/2013  Medication Sig  . estradiol (ESTRACE) 2 MG tablet TAKE 1 TABLET BY MOUTH EVERY DAY  . valACYclovir (VALTREX) 500 MG tablet Take 1 tablet (500 mg total) by mouth 2 (two) times daily.  . predniSONE (DELTASONE) 5 MG tablet Take 0.5 tablets (2.5 mg total) by mouth daily.     Review of Systems  Constitutional: Negative for fever, chills, appetite change, fatigue and unexpected weight change.  HENT: Negative for congestion, ear pain, sinus pressure, sore throat, trouble swallowing and voice change.   Eyes: Negative for visual disturbance.  Respiratory: Negative for cough, shortness of breath, wheezing and stridor.   Cardiovascular: Negative for chest pain, palpitations and leg swelling.  Gastrointestinal: Negative for nausea, vomiting, abdominal pain, diarrhea, constipation, blood in stool, abdominal distention and anal bleeding.  Genitourinary: Negative for dysuria and flank pain.  Musculoskeletal: Positive for arthralgias and myalgias. Negative for gait problem and neck pain.  Skin: Negative for color change and rash.  Neurological: Negative for dizziness and headaches.  Hematological: Negative for adenopathy. Does not bruise/bleed easily.  Psychiatric/Behavioral: Negative for suicidal ideas, sleep disturbance and dysphoric mood. The patient is not nervous/anxious.        Objective:     Physical Exam  Constitutional: She is oriented to person, place, and time. She appears well-developed and well-nourished. No distress.  HENT:  Head: Normocephalic and atraumatic.  Right Ear: External ear normal.  Left Ear: External ear normal.  Nose: Nose normal.  Mouth/Throat: Oropharynx is clear and moist. No oropharyngeal exudate.  Eyes: Conjunctivae are normal. Pupils are equal, round, and reactive to light. Right eye exhibits no discharge. Left eye exhibits no discharge. No scleral icterus.  Neck: Normal range of motion. Neck supple. No tracheal deviation present. No thyromegaly present.  Cardiovascular: Normal rate, regular rhythm, normal heart sounds and intact distal pulses.  Exam reveals no gallop and no friction rub.   No murmur heard. Pulmonary/Chest: Effort normal and breath sounds normal. No accessory muscle usage. Not tachypneic. No respiratory distress. She has no decreased breath sounds. She has no wheezes. She has no rhonchi. She has no rales. She exhibits no tenderness.  Musculoskeletal: Normal range of motion. She exhibits no edema and no tenderness.  Lymphadenopathy:    She has no cervical adenopathy.  Neurological: She is alert and oriented to person, place, and time. No cranial nerve deficit. She exhibits normal muscle tone. Coordination normal.  Skin: Skin is warm and dry. No rash noted. She is not diaphoretic. There is erythema. No pallor.     Psychiatric: She has a normal mood and affect. Her behavior is normal. Judgment and thought content normal.          Assessment & Plan:

## 2013-02-11 NOTE — Assessment & Plan Note (Signed)
Chronic arthralgia on long-standing low-dose prednisone. She has tapered down to 2.5 mg daily. Encourage continued efforts to taper as tolerated.

## 2013-02-11 NOTE — Progress Notes (Signed)
Pre visit review using our clinic review tool, if applicable. No additional management support is needed unless otherwise documented below in the visit note. 

## 2013-02-12 NOTE — Addendum Note (Signed)
Addended by: Sharlyne Pacas on: 02/12/2013 09:30 AM   Modules accepted: Orders

## 2013-02-12 NOTE — Addendum Note (Signed)
Addended by: Sharlyne Pacas on: 02/12/2013 09:33 AM   Modules accepted: Orders

## 2013-02-13 ENCOUNTER — Encounter: Payer: Self-pay | Admitting: Internal Medicine

## 2013-02-13 LAB — TB SKIN TEST
Induration: 0 mm
TB Skin Test: NEGATIVE

## 2013-02-13 LAB — MRSA CULTURE

## 2013-02-13 NOTE — Addendum Note (Signed)
Addended by: Warden Fillers on: 02/13/2013 10:26 AM   Modules accepted: Orders

## 2013-02-19 ENCOUNTER — Encounter: Payer: Self-pay | Admitting: Internal Medicine

## 2013-02-20 MED ORDER — PREDNISONE 5 MG PO TABS: 2.5000 mg | ORAL_TABLET | Freq: Every day | ORAL | Status: DC

## 2013-02-22 ENCOUNTER — Encounter: Payer: Self-pay | Admitting: Internal Medicine

## 2013-02-25 ENCOUNTER — Encounter: Payer: Self-pay | Admitting: Internal Medicine

## 2013-02-26 ENCOUNTER — Encounter: Payer: Self-pay | Admitting: Internal Medicine

## 2013-02-26 ENCOUNTER — Ambulatory Visit: Payer: PRIVATE HEALTH INSURANCE | Admitting: Internal Medicine

## 2013-02-28 ENCOUNTER — Other Ambulatory Visit: Payer: Self-pay | Admitting: Internal Medicine

## 2013-03-15 ENCOUNTER — Encounter: Payer: Self-pay | Admitting: Internal Medicine

## 2013-03-15 ENCOUNTER — Ambulatory Visit (INDEPENDENT_AMBULATORY_CARE_PROVIDER_SITE_OTHER): Payer: 59 | Admitting: Internal Medicine

## 2013-03-15 VITALS — BP 110/78 | HR 64 | Temp 97.5°F | Wt 117.0 lb

## 2013-03-15 DIAGNOSIS — M7710 Lateral epicondylitis, unspecified elbow: Secondary | ICD-10-CM

## 2013-03-15 DIAGNOSIS — R252 Cramp and spasm: Secondary | ICD-10-CM

## 2013-03-15 DIAGNOSIS — M7711 Lateral epicondylitis, right elbow: Secondary | ICD-10-CM

## 2013-03-15 DIAGNOSIS — L0292 Furuncle, unspecified: Secondary | ICD-10-CM

## 2013-03-15 DIAGNOSIS — R5381 Other malaise: Secondary | ICD-10-CM

## 2013-03-15 DIAGNOSIS — R5383 Other fatigue: Secondary | ICD-10-CM

## 2013-03-15 DIAGNOSIS — L0293 Carbuncle, unspecified: Secondary | ICD-10-CM

## 2013-03-15 LAB — COMPREHENSIVE METABOLIC PANEL
ALBUMIN: 4.2 g/dL (ref 3.5–5.2)
ALT: 17 U/L (ref 0–35)
AST: 19 U/L (ref 0–37)
Alkaline Phosphatase: 43 U/L (ref 39–117)
BUN: 10 mg/dL (ref 6–23)
CALCIUM: 9.4 mg/dL (ref 8.4–10.5)
CHLORIDE: 103 meq/L (ref 96–112)
CO2: 28 meq/L (ref 19–32)
Creat: 0.67 mg/dL (ref 0.50–1.10)
GLUCOSE: 80 mg/dL (ref 70–99)
POTASSIUM: 4.5 meq/L (ref 3.5–5.3)
Sodium: 140 mEq/L (ref 135–145)
Total Bilirubin: 0.3 mg/dL (ref 0.3–1.2)
Total Protein: 6.9 g/dL (ref 6.0–8.3)

## 2013-03-15 LAB — CBC
HCT: 38.5 % (ref 36.0–46.0)
Hemoglobin: 13.1 g/dL (ref 12.0–15.0)
MCH: 31.2 pg (ref 26.0–34.0)
MCHC: 34 g/dL (ref 30.0–36.0)
MCV: 91.7 fL (ref 78.0–100.0)
PLATELETS: 286 10*3/uL (ref 150–400)
RBC: 4.2 MIL/uL (ref 3.87–5.11)
RDW: 13.9 % (ref 11.5–15.5)
WBC: 6.9 10*3/uL (ref 4.0–10.5)

## 2013-03-15 LAB — TSH: TSH: 1.827 u[IU]/mL (ref 0.350–4.500)

## 2013-03-15 NOTE — Patient Instructions (Signed)
Lateral Epicondylitis (Tennis Elbow) with Rehab Lateral epicondylitis involves inflammation and pain around the outer portion of the elbow. The pain is caused by inflammation of the tendons in the forearm that bring back (extend) the wrist. Lateral epicondylittis is also called tennis elbow, because it is very common in tennis players. However, it may occur in any individual who extends the wrist repetitively. If lateral epicondylitis is left untreated, it may become a chronic problem. SYMPTOMS   Pain, tenderness, and inflammation on the outer (lateral) side of the elbow.  Pain or weakness with gripping activities.  Pain that increases with wrist twisting motions (playing tennis, using a screwdriver, opening a door or a jar).  Pain with lifting objects, including a coffee cup. CAUSES  Lateral epicondylitis is caused by inflammation of the tendons that extend the wrist. Causes of injury may include:  Repetitive stress and strain on the muscles and tendons that extend the wrist.  Sudden change in activity level or intensity.  Incorrect grip in racquet sports.  Incorrect grip size of racquet (often too large).  Incorrect hitting position or technique (usually backhand, leading with the elbow).  Using a racket that is too heavy. RISK INCREASES WITH:  Sports or occupations that require repetitive and/or strenuous forearm and wrist movements (tennis, squash, racquetball, carpentry).  Poor wrist and forearm strength and flexibility.  Failure to warm up properly before activity.  Resuming activity before healing, rehabilitation, and conditioning are complete. PREVENTION   Warm up and stretch properly before activity.  Maintain physical fitness:  Strength, flexibility, and endurance.  Cardiovascular fitness.  Wear and use properly fitted equipment.  Learn and use proper technique and have a coach correct improper technique.  Wear a tennis elbow (counterforce) brace. PROGNOSIS   The course of this condition depends on the degree of the injury. If treated properly, acute cases (symptoms lasting less than 4 weeks) are often resolved in 2 to 6 weeks. Chronic (longer lasting cases) often resolve in 3 to 6 months, but may require physical therapy. RELATED COMPLICATIONS   Frequently recurring symptoms, resulting in a chronic problem. Properly treating the problem the first time decreases frequency of recurrence.  Chronic inflammation, scarring tendon degeneration, and partial tendon tear, requiring surgery.  Delayed healing or resolution of symptoms. TREATMENT  Treatment first involves the use of ice and medicine, to reduce pain and inflammation. Strengthening and stretching exercises may help reduce discomfort, if performed regularly. These exercises may be performed at home, if the condition is an acute injury. Chronic cases may require a referral to a physical therapist for evaluation and treatment. Your caregiver may advise a corticosteroid injection, to help reduce inflammation. Rarely, surgery is needed. MEDICATION  If pain medicine is needed, nonsteroidal anti-inflammatory medicines (aspirin and ibuprofen), or other minor pain relievers (acetaminophen), are often advised.  Do not take pain medicine for 7 days before surgery.  Prescription pain relievers may be given, if your caregiver thinks they are needed. Use only as directed and only as much as you need.  Corticosteroid injections may be recommended. These injections should be reserved only for the most severe cases, because they can only be given a certain number of times. HEAT AND COLD  Cold treatment (icing) should be applied for 10 to 15 minutes every 2 to 3 hours for inflammation and pain, and immediately after activity that aggravates your symptoms. Use ice packs or an ice massage.  Heat treatment may be used before performing stretching and strengthening activities prescribed by your   caregiver, physical  therapist, or athletic trainer. Use a heat pack or a warm water soak. SEEK MEDICAL CARE IF: Symptoms get worse or do not improve in 2 weeks, despite treatment. EXERCISES  RANGE OF MOTION (ROM) AND STRETCHING EXERCISES - Epicondylitis, Lateral (Tennis Elbow) These exercises may help you when beginning to rehabilitate your injury. Your symptoms may go away with or without further involvement from your physician, physical therapist or athletic trainer. While completing these exercises, remember:   Restoring tissue flexibility helps normal motion to return to the joints. This allows healthier, less painful movement and activity.  An effective stretch should be held for at least 30 seconds.  A stretch should never be painful. You should only feel a gentle lengthening or release in the stretched tissue. RANGE OF MOTION  Wrist Flexion, Active-Assisted  Extend your right / left elbow with your fingers pointing down.*  Gently pull the back of your hand towards you, until you feel a gentle stretch on the top of your forearm.  Hold this position for __________ seconds. Repeat __________ times. Complete this exercise __________ times per day.  *If directed by your physician, physical therapist or athletic trainer, complete this stretch with your elbow bent, rather than extended. RANGE OF MOTION  Wrist Extension, Active-Assisted  Extend your right / left elbow and turn your palm upwards.*  Gently pull your palm and fingertips back, so your wrist extends and your fingers point more toward the ground.  You should feel a gentle stretch on the inside of your forearm.  Hold this position for __________ seconds. Repeat __________ times. Complete this exercise __________ times per day. *If directed by your physician, physical therapist or athletic trainer, complete this stretch with your elbow bent, rather than extended. STRETCH - Wrist Flexion  Place the back of your right / left hand on a tabletop,  leaving your elbow slightly bent. Your fingers should point away from your body.  Gently press the back of your hand down onto the table by straightening your elbow. You should feel a stretch on the top of your forearm.  Hold this position for __________ seconds. Repeat __________ times. Complete this stretch __________ times per day.  STRETCH  Wrist Extension   Place your right / left fingertips on a tabletop, leaving your elbow slightly bent. Your fingers should point backwards.  Gently press your fingers and palm down onto the table by straightening your elbow. You should feel a stretch on the inside of your forearm.  Hold this position for __________ seconds. Repeat __________ times. Complete this stretch __________ times per day.  STRENGTHENING EXERCISES - Epicondylitis, Lateral (Tennis Elbow) These exercises may help you when beginning to rehabilitate your injury. They may resolve your symptoms with or without further involvement from your physician, physical therapist or athletic trainer. While completing these exercises, remember:   Muscles can gain both the endurance and the strength needed for everyday activities through controlled exercises.  Complete these exercises as instructed by your physician, physical therapist or athletic trainer. Increase the resistance and repetitions only as guided.  You may experience muscle soreness or fatigue, but the pain or discomfort you are trying to eliminate should never worsen during these exercises. If this pain does get worse, stop and make sure you are following the directions exactly. If the pain is still present after adjustments, discontinue the exercise until you can discuss the trouble with your caregiver. STRENGTH Wrist Flexors  Sit with your right / left forearm palm-up and   fully supported on a table or countertop. Your elbow should be resting below the height of your shoulder. Allow your wrist to extend over the edge of the  surface.  Loosely holding a __________ weight, or a piece of rubber exercise band or tubing, slowly curl your hand up toward your forearm.  Hold this position for __________ seconds. Slowly lower the wrist back to the starting position in a controlled manner. Repeat __________ times. Complete this exercise __________ times per day.  STRENGTH  Wrist Extensors  Sit with your right / left forearm palm-down and fully supported on a table or countertop. Your elbow should be resting below the height of your shoulder. Allow your wrist to extend over the edge of the surface.  Loosely holding a __________ weight, or a piece of rubber exercise band or tubing, slowly curl your hand up toward your forearm.  Hold this position for __________ seconds. Slowly lower the wrist back to the starting position in a controlled manner. Repeat __________ times. Complete this exercise __________ times per day.  STRENGTH - Ulnar Deviators  Stand with a ____________________ weight in your right / left hand, or sit while holding a rubber exercise band or tubing, with your healthy arm supported on a table or countertop.  Move your wrist, so that your pinkie travels toward your forearm and your thumb moves away from your forearm.  Hold this position for __________ seconds and then slowly lower the wrist back to the starting position. Repeat __________ times. Complete this exercise __________ times per day STRENGTH - Radial Deviators  Stand with a ____________________ weight in your right / left hand, or sit while holding a rubber exercise band or tubing, with your injured arm supported on a table or countertop.  Raise your hand upward in front of you or pull up on the rubber tubing.  Hold this position for __________ seconds and then slowly lower the wrist back to the starting position. Repeat __________ times. Complete this exercise __________ times per day. STRENGTH  Forearm Supinators   Sit with your right /  left forearm supported on a table, keeping your elbow below shoulder height. Rest your hand over the edge, palm down.  Gently grip a hammer or a soup ladle.  Without moving your elbow, slowly turn your palm and hand upward to a "thumbs-up" position.  Hold this position for __________ seconds. Slowly return to the starting position. Repeat __________ times. Complete this exercise __________ times per day.  STRENGTH  Forearm Pronators   Sit with your right / left forearm supported on a table, keeping your elbow below shoulder height. Rest your hand over the edge, palm up.  Gently grip a hammer or a soup ladle.  Without moving your elbow, slowly turn your palm and hand upward to a "thumbs-up" position.  Hold this position for __________ seconds. Slowly return to the starting position. Repeat __________ times. Complete this exercise __________ times per day.  STRENGTH - Grip  Grasp a tennis ball, a dense sponge, or a large, rolled sock in your hand.  Squeeze as hard as you can, without increasing any pain.  Hold this position for __________ seconds. Release your grip slowly. Repeat __________ times. Complete this exercise __________ times per day.  STRENGTH - Elbow Extensors, Isometric  Stand or sit upright, on a firm surface. Place your right / left arm so that your palm faces your stomach, and it is at the height of your waist.  Place your opposite hand on   the underside of your forearm. Gently push up as your right / left arm resists. Push as hard as you can with both arms, without causing any pain or movement at your right / left elbow. Hold this stationary position for __________ seconds. Gradually release the tension in both arms. Allow your muscles to relax completely before repeating. Document Released: 02/07/2005 Document Revised: 05/02/2011 Document Reviewed: 05/22/2008 [employer] Patient Information 2014 [employer].  

## 2013-03-15 NOTE — Progress Notes (Signed)
Pre-visit discussion using our clinic review tool. No additional management support is needed unless otherwise documented below in the visit note.  

## 2013-03-16 DIAGNOSIS — R5381 Other malaise: Secondary | ICD-10-CM | POA: Insufficient documentation

## 2013-03-16 DIAGNOSIS — M7711 Lateral epicondylitis, right elbow: Secondary | ICD-10-CM | POA: Insufficient documentation

## 2013-03-16 DIAGNOSIS — L0293 Carbuncle, unspecified: Secondary | ICD-10-CM | POA: Insufficient documentation

## 2013-03-16 DIAGNOSIS — R252 Cramp and spasm: Secondary | ICD-10-CM | POA: Insufficient documentation

## 2013-03-16 DIAGNOSIS — R5383 Other fatigue: Secondary | ICD-10-CM

## 2013-03-16 LAB — HEMOGLOBIN A1C
HEMOGLOBIN A1C: 5.3 % (ref <5.7)
Mean Plasma Glucose: 105 mg/dL (ref <117)

## 2013-03-16 NOTE — Assessment & Plan Note (Signed)
Symptoms of recurrent boils in groin, however no current lesions. Test for MRSA was pos, but they did not provide sensitivities. Will resend. Consider decontamination protocol if symptoms recurrent.

## 2013-03-16 NOTE — Assessment & Plan Note (Signed)
Symptoms and exam consistent with right lateral epicondylitis. Recommended immobilization with sling, frequent icing. We discussed increasing dose of prednisone for short period to help with inflammation, but will hold off for now. We also discussed eval by sports med and use of nitro patch, but will hold off. She will call if symptoms are not improving.

## 2013-03-16 NOTE — Assessment & Plan Note (Signed)
Chronic generalized fatigue without focal symptoms. Will check CBC, CMP, TSH with labs today.

## 2013-03-16 NOTE — Assessment & Plan Note (Signed)
Diffuse muscle cramping noted over last few weeks. Will check electrolytes, CBC, TSH with labs.

## 2013-03-16 NOTE — Progress Notes (Signed)
Subjective:    Patient ID: Lauren Whitaker, female    DOB: April 20, 1955, 58 y.o.   MRN: [id number]  HPI 58 year old female with history of chronic arthralgia on long-term therapy with prednisone, and recent MRSA infection presents for followup. She reports that in the interim since her last visit she had a recurrent boil in her groin. This has now resolved. The boil occurred while she was taking doxycycline. She denies any recent boils, fever, chills.  She is concerned about several week history of right lateral elbow pain. She denies trauma to her elbow. Pain is made worse by flexion and extension of her right forearm. She has not taken anything for pain.  She is also concerned about several months of generalized fatigue. She also notes diffuse muscle cramping. She denies any focal symptoms such as chest pain, shortness of breath, change in bowel habits.  Outpatient Encounter Prescriptions as of 03/15/2013  Medication Sig  . estradiol (ESTRACE) 2 MG tablet TAKE 1 TABLET BY MOUTH EVERY DAY  . predniSONE (DELTASONE) 5 MG tablet TAKE 1/2 A TABLET (2.5 MG TOTAL) BY MOUTH DAILY.  . valACYclovir (VALTREX) 500 MG tablet Take 1 tablet (500 mg total) by mouth 2 (two) times daily.   BP 110/78  Pulse 64  Temp(Src) 97.5 F (36.4 C) (Oral)  Wt 117 lb (53.071 kg)  SpO2 98%  Review of Systems  Constitutional: Positive for fatigue. Negative for fever, chills, appetite change and unexpected weight change.  HENT: Negative for congestion, ear pain, sinus pressure, sore throat, trouble swallowing and voice change.   Eyes: Negative for visual disturbance.  Respiratory: Negative for cough, shortness of breath, wheezing and stridor.   Cardiovascular: Negative for chest pain, palpitations and leg swelling.  Gastrointestinal: Negative for nausea, vomiting, abdominal pain, diarrhea, constipation, blood in stool, abdominal distention and anal bleeding.  Genitourinary: Negative for dysuria and flank pain.    Musculoskeletal: Positive for arthralgias and myalgias. Negative for gait problem and neck pain.  Skin: Negative for color change and rash.  Neurological: Negative for dizziness and headaches.  Hematological: Negative for adenopathy. Does not bruise/bleed easily.  Psychiatric/Behavioral: Negative for suicidal ideas, sleep disturbance and dysphoric mood. The patient is not nervous/anxious.        Objective:   Physical Exam  Constitutional: She is oriented to person, place, and time. She appears well-developed and well-nourished. No distress.  HENT:  Head: Normocephalic and atraumatic.  Right Ear: External ear normal.  Left Ear: External ear normal.  Nose: Nose normal.  Mouth/Throat: Oropharynx is clear and moist. No oropharyngeal exudate.  Eyes: Conjunctivae are normal. Pupils are equal, round, and reactive to light. Right eye exhibits no discharge. Left eye exhibits no discharge. No scleral icterus.  Neck: Normal range of motion. Neck supple. No tracheal deviation present. No thyromegaly present.  Cardiovascular: Normal rate, regular rhythm, normal heart sounds and intact distal pulses.  Exam reveals no gallop and no friction rub.   No murmur heard. Pulmonary/Chest: Effort normal and breath sounds normal. No accessory muscle usage. Not tachypneic. No respiratory distress. She has no decreased breath sounds. She has no wheezes. She has no rhonchi. She has no rales. She exhibits no tenderness.  Musculoskeletal: Normal range of motion. She exhibits no edema.       Right elbow: She exhibits normal range of motion, no swelling and no deformity. Tenderness found. Lateral epicondyle tenderness noted.  Lymphadenopathy:    She has no cervical adenopathy.  Neurological: She is alert and oriented to  person, place, and time. No cranial nerve deficit. She exhibits normal muscle tone. Coordination normal.  Skin: Skin is warm and dry. No rash noted. She is not diaphoretic. No erythema. No pallor.   Psychiatric: She has a normal mood and affect. Her behavior is normal. Judgment and thought content normal.          Assessment & Plan:

## 2013-03-18 ENCOUNTER — Encounter: Payer: Self-pay | Admitting: Internal Medicine

## 2013-03-18 DIAGNOSIS — Z820 Family history of epilepsy and other diseases of the nervous system: Secondary | ICD-10-CM

## 2013-03-18 DIAGNOSIS — IMO0001 Reserved for inherently not codable concepts without codable children: Secondary | ICD-10-CM

## 2013-03-18 LAB — MRSA CULTURE

## 2013-03-19 ENCOUNTER — Encounter: Payer: Self-pay | Admitting: Internal Medicine

## 2013-03-23 ENCOUNTER — Other Ambulatory Visit: Payer: Self-pay | Admitting: Internal Medicine

## 2013-03-25 ENCOUNTER — Encounter: Payer: Self-pay | Admitting: Internal Medicine

## 2013-03-25 NOTE — Telephone Encounter (Signed)
Ok refill? 

## 2013-03-25 NOTE — Telephone Encounter (Signed)
I don't know why you got duplicate, but it has not been taken care of.

## 2013-03-25 NOTE — Telephone Encounter (Signed)
I'm getting a duplicate message. Has this been taken care of already?

## 2013-03-27 ENCOUNTER — Encounter: Payer: Self-pay | Admitting: Internal Medicine

## 2013-03-29 ENCOUNTER — Encounter: Payer: Self-pay | Admitting: Internal Medicine

## 2013-03-30 ENCOUNTER — Other Ambulatory Visit: Payer: Self-pay | Admitting: Internal Medicine

## 2013-03-30 NOTE — Telephone Encounter (Signed)
Okay to refill? 

## 2013-03-31 MED ORDER — PREDNISONE 5 MG PO TABS: 5.0000 mg | ORAL_TABLET | Freq: Every day | ORAL | Status: DC

## 2013-04-01 ENCOUNTER — Encounter: Payer: Self-pay | Admitting: Internal Medicine

## 2013-04-01 ENCOUNTER — Ambulatory Visit (INDEPENDENT_AMBULATORY_CARE_PROVIDER_SITE_OTHER): Payer: 59 | Admitting: Internal Medicine

## 2013-04-01 VITALS — BP 128/80 | HR 80 | Ht 62.0 in | Wt 118.2 lb

## 2013-04-01 DIAGNOSIS — R1030 Lower abdominal pain, unspecified: Secondary | ICD-10-CM

## 2013-04-01 DIAGNOSIS — Z1211 Encounter for screening for malignant neoplasm of colon: Secondary | ICD-10-CM

## 2013-04-01 DIAGNOSIS — R109 Unspecified abdominal pain: Secondary | ICD-10-CM

## 2013-04-01 DIAGNOSIS — K5900 Constipation, unspecified: Secondary | ICD-10-CM

## 2013-04-01 DIAGNOSIS — Z9889 Other specified postprocedural states: Secondary | ICD-10-CM

## 2013-04-01 DIAGNOSIS — R933 Abnormal findings on diagnostic imaging of other parts of digestive tract: Secondary | ICD-10-CM

## 2013-04-01 MED ORDER — LINACLOTIDE 145 MCG PO CAPS: 145.0000 ug | ORAL_CAPSULE | Freq: Every day | ORAL | Status: DC

## 2013-04-01 MED ORDER — MOVIPREP 100 G PO SOLR: ORAL | Status: DC

## 2013-04-01 NOTE — Patient Instructions (Signed)
You have been scheduled for a colonoscopy with propofol. Please follow written instructions given to you at your visit today.  Please pick up your prep kit at the pharmacy within the next 1-3 days. If you use inhalers (even only as needed), please bring them with you on the day of your procedure. Your physician has requested that you go to [email or web address] and enter the access code given to you at your visit today. This web site gives a general overview about your procedure. However, you should still follow specific instructions given to you by our office regarding your preparation for the procedure.  We have sent the following medications to your pharmacy for you to pick up at your convenience: moviprep, Linzess 145 mcg take 1 tablet daily with a meal                                               We are excited to introduce MyChart, a new best-in-class service that provides you online access to important information in your electronic medical record. We want to make it easier for you to view your health information - all in one secure location - when and where you need it. We expect MyChart will enhance the quality of care and service we provide.  When you register for MyChart, you can:    View your test results.    Request appointments and receive appointment reminders via email.    Request medication renewals.    View your medical history, allergies, medications and immunizations.    Communicate with your physician's office through a password-protected site.    Conveniently print information such as your medication lists.  To find out if MyChart is right for you, please talk to a member of our clinical staff today. We will gladly answer your questions about this free health and wellness tool.  If you are age 60 or older and want a member of your family to have access to your record, you must provide written consent by completing a proxy form available at our office. Please speak to our  clinical staff about guidelines regarding accounts for patients younger than age 10.  As you activate your MyChart account and need any technical assistance, please call the MyChart technical support line at [phone]) or email your question to [email or web address]. If you email your question(s), please include your name, a return phone number and the best time to reach you.  If you have non-urgent health-related questions, you can send a message to our office through [place] at [email or web address]. If you have a medical emergency, call 911.  Thank you for using MyChart as your new health and wellness resource!   MyChart licensed from [employer],  1999-2010. Patents Pending.

## 2013-04-01 NOTE — Progress Notes (Signed)
Patient ID: Lauren Whitaker, female   DOB: February 16, 1956, 58 y.o.   MRN: [id number] HPI: Lauren Whitaker is a 58 year old female with a past medical history of chronic joint pain, cervical disc disease, hysterectomy, bladder tack, and rectocele repair in 1992, and chronic constipation who is seen in consultation at the request of Dr. Gilford Rile for evaluation of chronic constipation and lower abdominal pain. The patient reports that she has had trouble with chronic constipation associated with lower back pain, and episodic nausea with vomiting ever since her initial pelvic surgery in 1992. Since this time she is used daily bisacodyl and stool softeners along with mineral oil on occasion to help induce bowel movement. She reports without laxatives she will not have a bowel movement at all. She describes this as stool "sticking" in her left colon and only moving with laxatives. She reports certain foods make it very difficult for her to have a bowel movement and these include beans, corn, high-fiber foods. She was evaluated for this problem in [place] about 9 years ago and underwent upper endoscopy and colonoscopy at that time. She recalls a rectal surgery in [place] which sounds like a rectal prolapse surgery. A left colon surgery was also recommended but never performed. She was told "there was a laxity in her left colon" leading to intermittent obstructive-type symptoms. When she does develop severe constipation this is often associated with lower, left-sided back pain, nausea with vomiting at times near syncope. She's also had diaphoresis when she is having severe lower back pain. Outside of episodes of constipation and lower left back pain she denies abdominal pain. Some intermittent regurgitation but she notices with bending over. No significant heartburn. No dysphagia or odynophagia. No weight loss. She denies rectal bleeding or melena.  Family history notable for esophageal cancer in her grandfather. No GI  cancer in first-degree relatives. No family history of IBD. No known history of celiac disease.  Past Medical History  Diagnosis Date  . Syncope   . Vitamin B12 deficiency   . Iron deficiency anemia   . Cat allergies   . Fatigue   . Constipation   . Stomach disorder   . Chronic joint pain   . Cervical herniated disc   . Mitral valve prolapse   . Herpes genitalis     Past Surgical History  Procedure Laterality Date  . Tonsillectomy and adenoidectomy    . Hysterectomy (other)    . Bladder tact    . Retrocycle repair    . Herniated disc    . Repair to former Aeronautical engineer    . Plate and screws in left hand    . Join replacement in left foot    . Abdominal hysterectomy  1992    Current Outpatient Prescriptions  Medication Sig Dispense Refill  . estradiol (ESTRACE) 2 MG tablet TAKE 1 TABLET BY MOUTH EVERY DAY  30 tablet  5  . predniSONE (DELTASONE) 5 MG tablet Take 5 mg by mouth daily with breakfast. Takes 2.5      . valACYclovir (VALTREX) 500 MG tablet Take 1 tablet (500 mg total) by mouth 2 (two) times daily.  30 tablet  3  . Linaclotide (LINZESS) 145 MCG CAPS capsule Take 1 capsule (145 mcg total) by mouth daily.  30 capsule  6  . MOVIPREP 100 G SOLR Use per prep instruction  1 kit  0   No current facility-administered medications for this visit.    Allergies  Allergen Reactions  . Boniva [Ibandronate Sodium]  Endocrine and Metabolic agents  . Influenza Vaccines     Dyspnea with pleurisy    Family History  Problem Relation Age of Onset  . Rheumatic fever Father     when he was young  . Heart attack Father   . ALS Brother     History  Substance Use Topics  . Smoking status: Never Smoker   . Smokeless tobacco: Never Used     Comment: tobacco use- no   . Alcohol Use: No    ROS: As per history of present illness, otherwise negative  BP 128/80  Pulse 80  Ht _0  (1.575 m)  Wt 118 lb 3.2 oz (53.615 kg)  BMI 21.61 kg/m2 Constitutional:  Well-developed and well-nourished. No distress. HEENT: Normocephalic and atraumatic. Oropharynx is clear and moist. No oropharyngeal exudate. Conjunctivae are normal.  No scleral icterus. Neck: Neck supple. Trachea midline. Cardiovascular: Normal rate, regular rhythm and intact distal pulses. No M/R/G Pulmonary/chest: Effort normal and breath sounds normal. No wheezing, rales or rhonchi. Abdominal: Soft, nontender, nondistended. Bowel sounds active throughout. Extremities: no clubbing, cyanosis, or edema Lymphadenopathy: No cervical adenopathy noted. Neurological: Alert and oriented to person place and time. Skin: Skin is warm and dry. No rashes noted. Psychiatric: Normal mood and affect. Behavior is normal.  RELEVANT LABS AND IMAGING: CBC    Component Value Date/Time   WBC 6.9 03/15/2013 1611   RBC 4.20 03/15/2013 1611   HGB 13.1 03/15/2013 1611   HCT 38.5 03/15/2013 1611   PLT 286 03/15/2013 1611   MCV 91.7 03/15/2013 1611   MCH 31.2 03/15/2013 1611   MCHC 34.0 03/15/2013 1611   RDW 13.9 03/15/2013 1611   LYMPHSABS 2.9 10/10/2011 1615   MONOABS 0.6 10/10/2011 1615   EOSABS 0.1 10/10/2011 1615   BASOSABS 0.1 10/10/2011 1615    CMP     Component Value Date/Time   NA 140 03/15/2013 1611   K 4.5 03/15/2013 1611   CL 103 03/15/2013 1611   CO2 28 03/15/2013 1611   GLUCOSE 80 03/15/2013 1611   BUN 10 03/15/2013 1611   CREATININE 0.67 03/15/2013 1611   CREATININE 0.8 10/10/2011 1615   CALCIUM 9.4 03/15/2013 1611   PROT 6.9 03/15/2013 1611   ALBUMIN 4.2 03/15/2013 1611   AST 19 03/15/2013 1611   ALT 17 03/15/2013 1611   ALKPHOS 43 03/15/2013 1611   BILITOT 0.3 03/15/2013 1611   TSH normal  CT scan of the abdomen and pelvis with contrast, [facility], 10/11/2011 --Sigmoid and left colonic wall thickening. Possibly suggesting colitis. No evidence of diverticulosis. No other acute abnormality  ASSESSMENT/PLAN: 58 year old female with a past medical history of chronic joint pain, cervical disc disease,  hysterectomy, bladder tack, and rectocele repair in 1992, and chronic constipation who is seen in consultation at the request of Dr. Gilford Rile for evaluation of chronic constipation and lower abdominal pain.   1.  Constipation/left lower back pain associated with nausea and vomiting/history of abnormal GI imaging/history of pelvic surgery -- listening to her history and how her change in bowel movements/constipation related to surgery raises the question of abdominal adhesive disease. There was evidence of left-sided colonic thickening on a CT scan from August 2013, which also raises the question of inflammatory bowel disease. I have recommended colonoscopy for direct visualization. It is possible that after pelvic surgery she has an area of colonic narrowing or an acute colonic angle which formed due to scar tissue which could lead to severe constipation and subsequent pain.  I  will try her on Linzess 145 mcg daily. This will be in place of her over-the-counter laxatives. We discussed the side effect of diarrhea she will notify me if this occurs. She may need a higher dose, and again asked that she call me if she is not getting a good result with this medication. I would expect her to have complete and spontaneous bowel movements with this medication, if it is going to provide benefit.  We discussed colonoscopy today including risks and benefits and she is agreeable to proceed. Further recommendations thereafter  2.  CRC screening -- colonoscopy as in #1

## 2013-04-02 ENCOUNTER — Other Ambulatory Visit: Payer: Self-pay | Admitting: Internal Medicine

## 2013-04-04 ENCOUNTER — Other Ambulatory Visit: Payer: Self-pay | Admitting: Gastroenterology

## 2013-04-04 ENCOUNTER — Encounter: Payer: Self-pay | Admitting: Internal Medicine

## 2013-04-04 ENCOUNTER — Telehealth: Payer: Self-pay | Admitting: Gastroenterology

## 2013-04-04 DIAGNOSIS — Z1211 Encounter for screening for malignant neoplasm of colon: Secondary | ICD-10-CM

## 2013-04-04 DIAGNOSIS — R1030 Lower abdominal pain, unspecified: Secondary | ICD-10-CM

## 2013-04-04 DIAGNOSIS — K5900 Constipation, unspecified: Secondary | ICD-10-CM

## 2013-04-04 NOTE — Telephone Encounter (Signed)
Faxed prior authorization form to [employer] @ [phone] for Linzess 

## 2013-04-05 ENCOUNTER — Encounter: Payer: Self-pay | Admitting: Internal Medicine

## 2013-04-08 ENCOUNTER — Encounter: Payer: Self-pay | Admitting: Internal Medicine

## 2013-04-10 ENCOUNTER — Encounter: Payer: 59 | Admitting: Internal Medicine

## 2013-04-11 ENCOUNTER — Encounter: Payer: Self-pay | Admitting: Internal Medicine

## 2013-04-17 ENCOUNTER — Encounter: Payer: Self-pay | Admitting: Internal Medicine

## 2013-04-18 ENCOUNTER — Encounter: Payer: 59 | Admitting: Internal Medicine

## 2013-04-18 ENCOUNTER — Ambulatory Visit: Payer: 59 | Admitting: Internal Medicine

## 2013-04-19 ENCOUNTER — Telehealth: Payer: Self-pay | Admitting: Gastroenterology

## 2013-04-19 NOTE — Telephone Encounter (Signed)
Resent prior auth request for Linzess. [employer] claimed they did not get first one.

## 2013-04-22 ENCOUNTER — Ambulatory Visit (AMBULATORY_SURGERY_CENTER): Payer: 59 | Admitting: Internal Medicine

## 2013-04-22 ENCOUNTER — Encounter: Payer: Self-pay | Admitting: Internal Medicine

## 2013-04-22 VITALS — BP 121/54 | HR 70 | Temp 97.8°F | Resp 18 | Ht 62.0 in | Wt 118.0 lb

## 2013-04-22 DIAGNOSIS — K5900 Constipation, unspecified: Secondary | ICD-10-CM

## 2013-04-22 DIAGNOSIS — Z1211 Encounter for screening for malignant neoplasm of colon: Secondary | ICD-10-CM

## 2013-04-22 MED ORDER — SODIUM CHLORIDE 0.9 % IV SOLN: 500.0000 mL | INTRAVENOUS | Status: DC

## 2013-04-22 NOTE — Progress Notes (Signed)
Procedure ends, to recovery, report given and VSS. 

## 2013-04-22 NOTE — Progress Notes (Signed)
Contrast given to pt 

## 2013-04-22 NOTE — Patient Instructions (Signed)
YOU HAD AN ENDOSCOPIC PROCEDURE TODAY AT THE [facility]: Refer to the procedure report that was given to you for any specific questions about what was found during the examination.  If the procedure report does not answer your questions, please call your gastroenterologist to clarify.  If you requested that your care partner not be given the details of your procedure findings, then the procedure report has been included in a sealed envelope for you to review at your convenience later.  YOU SHOULD EXPECT: Some feelings of bloating in the abdomen. Passage of more gas than usual.  Walking can help get rid of the air that was put into your GI tract during the procedure and reduce the bloating. If you had a lower endoscopy (such as a colonoscopy or flexible sigmoidoscopy) you may notice spotting of blood in your stool or on the toilet paper. If you underwent a bowel prep for your procedure, then you may not have a normal bowel movement for a few days.  DIET: Your first meal following the procedure should be a light meal and then it is ok to progress to your normal diet.  A half-sandwich or bowl of soup is an example of a good first meal.  Heavy or fried foods are harder to digest and may make you feel nauseous or bloated.  Likewise meals heavy in dairy and vegetables can cause extra gas to form and this can also increase the bloating.  Drink plenty of fluids but you should avoid alcoholic beverages for 24 hours.  ACTIVITY: Your care partner should take you home directly after the procedure.  You should plan to take it easy, moving slowly for the rest of the day.  You can resume normal activity the day after the procedure however you should NOT DRIVE or use heavy machinery for 24 hours (because of the sedation medicines used during the test).    SYMPTOMS TO REPORT IMMEDIATELY: A gastroenterologist can be reached at any hour.  During normal business hours, 8:30 AM to 5:00 PM Monday through Friday,  call [phone].  After hours and on weekends, please call the GI answering service at [phone] who will take a message and have the physician on call contact you.   Following lower endoscopy (colonoscopy or flexible sigmoidoscopy):  Excessive amounts of blood in the stool  Significant tenderness or worsening of abdominal pains  Swelling of the abdomen that is new, acute  Fever of 100F or higher  FOLLOW UP: Our staff will call the home number listed on your records the next business day following your procedure to check on you and address any questions or concerns that you may have at that time regarding the information given to you following your procedure. This is a courtesy call and so if there is no answer at the home number and we have not heard from you through the emergency physician on call, we will assume that you have returned to your regular daily activities without incident.  SIGNATURES/CONFIDENTIALITY: You and/or your care partner have signed paperwork which will be entered into your electronic medical record.  These signatures attest to the fact that that the information above on your After Visit Summary has been reviewed and is understood.  Full responsibility of the confidentiality of this discharge information lies with you and/or your care-partner.  Continue your normal medications; continue your daily laxative to promote regularity  Please read the handouts about diverticulosis and high fiber diets

## 2013-04-22 NOTE — Op Note (Signed)
[facility] [address] [place], [id number]   COLONOSCOPY PROCEDURE REPORT  PATIENT: [name], Lauren  MR#: [id number] BIRTHDATE: 1955/11/19 , 57  yrs. old GENDER: Female ENDOSCOPIST: Beverley FiedlerJay M Orlene Salmons, MD REFERRED XB:JYNWGNFABY:Jennifer Dan HumphreysWalker, M.D. PROCEDURE DATE:  04/22/2013 PROCEDURE:   Colonoscopy, screening First Screening Colonoscopy - Avg.  risk and is 50 yrs.  old or older - No.  Prior Negative Screening - Now for repeat screening. N/A  History of Adenoma - Now for follow-up colonoscopy & has been > or = to 3 yrs.  N/A  Polyps Removed Today? No.  Recommend repeat exam, <10 yrs? No. ASA CLASS:   Class II INDICATIONS:average risk screening, Constipation, and Abdominal pain. MEDICATIONS: MAC sedation, administered by CRNA and Propofol (Diprivan) 340 mg IV  DESCRIPTION OF PROCEDURE:   After the risks benefits and alternatives of the procedure were thoroughly explained, informed consent was obtained.  A digital rectal exam revealed no rectal mass but suture material was palpable.   The LB [id number] endoscope was introduced through the anus and advanced to the cecum, which was identified by both the appendix and ileocecal valve. No adverse events experienced.   The quality of the prep was Moviprep fair clearing to good with copious irrigation and lavage. The instrument was then slowly withdrawn as the colon was fully examined.   COLON FINDINGS: There was mild scattered diverticulosis noted in the distal sigmoid colon.  The colon was tortuous throughout its length.  Manual counter-pressure was used to reach the cecum. Evidence of prior surgery in the rectum with visible suture material.  Retroflexed views revealed no abnormalities. The time to cecum=5 minutes 57 seconds.  Withdrawal time=15 minutes 27 seconds. The scope was withdrawn and the procedure completed. COMPLICATIONS: There were no complications.  ENDOSCOPIC IMPRESSION: 1.  There was mild  diverticulosis noted in the distal sigmoid colon 2.  Evidence of prior intervention in the rectum, sutures palpable on DRE and visible in the distal rectum  RECOMMENDATIONS: 1.  Continue daily laxatives to promote regularity.  Preventing constipation is expected to improve abdominal pain 2.  You should continue to follow colorectal cancer screening guidelines for "routine risk" patients with a repeat colonoscopy in 10 years.  There is no need for FOBT (stool) testing for at least 5 years.   eSigned:  Beverley FiedlerJay M Jaritza Duignan, MD 04/22/2013 4:52 PM   cc: The Patient and Ronna PolioWalker, Jennifer MD

## 2013-04-23 ENCOUNTER — Telehealth: Payer: Self-pay

## 2013-04-23 DIAGNOSIS — R1032 Left lower quadrant pain: Secondary | ICD-10-CM

## 2013-04-23 NOTE — Telephone Encounter (Signed)
Patient notified of CT scan for 04/26/13

## 2013-04-23 NOTE — Telephone Encounter (Signed)
  Follow up Call-  Call back number 04/22/2013  Post procedure Call Back phone  # [phone]  Permission to leave phone message Yes     Patient questions:  Do you have a fever, pain , or abdominal swelling? no Pain Score  0 *  Have you tolerated food without any problems? yes  Have you been able to return to your normal activities? yes  Do you have any questions about your discharge instructions: Diet   no Medications  no Follow up visit  no  Do you have questions or concerns about your Care? no  Actions: * If pain score is 4 or above: No action needed, pain <4. Patient stated discomfort in one area of abdomin which was there prior to procedure. Stated ill be all right.

## 2013-04-23 NOTE — Telephone Encounter (Signed)
CT [facility] scheduled for 04/26/13 3:00 at [facility] I have left a message for the patient to call back and discuss

## 2013-04-26 ENCOUNTER — Ambulatory Visit (INDEPENDENT_AMBULATORY_CARE_PROVIDER_SITE_OTHER)
Admission: RE | Admit: 2013-04-26 | Discharge: 2013-04-26 | Disposition: A | Discharge status: Home / Self Care | Payer: 59 | Source: Ambulatory Visit | Attending: Internal Medicine | Admitting: Internal Medicine

## 2013-04-26 DIAGNOSIS — R1032 Left lower quadrant pain: Secondary | ICD-10-CM

## 2013-04-26 MED ORDER — IOHEXOL 300 MG/ML  SOLN
100.0000 mL | Freq: Once | INTRAMUSCULAR | Status: AC | PRN
  Administered 2013-04-26: 100 mL via INTRAVENOUS

## 2013-05-07 ENCOUNTER — Ambulatory Visit: Payer: 59 | Admitting: Neurology

## 2013-05-10 ENCOUNTER — Encounter: Payer: Self-pay | Admitting: Neurology

## 2013-07-13 ENCOUNTER — Other Ambulatory Visit: Payer: Self-pay | Admitting: Internal Medicine

## 2013-11-09 ENCOUNTER — Other Ambulatory Visit: Payer: Self-pay | Admitting: Internal Medicine

## 2013-11-11 NOTE — Telephone Encounter (Signed)
Okay to refill? Looks like Dr Rhea Belton is who prescribed to her last

## 2014-01-20 ENCOUNTER — Other Ambulatory Visit: Payer: Self-pay | Admitting: Internal Medicine

## 2014-02-23 ENCOUNTER — Other Ambulatory Visit: Payer: Self-pay | Admitting: Internal Medicine

## 2014-04-02 ENCOUNTER — Other Ambulatory Visit: Payer: Self-pay | Admitting: Internal Medicine

## 2014-07-10 ENCOUNTER — Telehealth: Payer: Self-pay | Admitting: *Deleted

## 2014-07-10 NOTE — Telephone Encounter (Signed)
Pt called in reference to Estradiol Rx being denied due to needing appoint.  pts last OV was 1.23.15.  Attempted to return call, left message to return call to schedule an appoint.

## 2014-07-16 ENCOUNTER — Encounter: Payer: Self-pay | Admitting: Internal Medicine

## 2014-07-16 ENCOUNTER — Ambulatory Visit (INDEPENDENT_AMBULATORY_CARE_PROVIDER_SITE_OTHER): Payer: Self-pay | Admitting: Internal Medicine

## 2014-07-16 VITALS — BP 112/71 | HR 80 | Temp 98.0°F | Ht 62.0 in | Wt 122.2 lb

## 2014-07-16 DIAGNOSIS — Z5989 Other problems related to housing and economic circumstances: Secondary | ICD-10-CM

## 2014-07-16 DIAGNOSIS — Z780 Asymptomatic menopausal state: Secondary | ICD-10-CM | POA: Insufficient documentation

## 2014-07-16 DIAGNOSIS — H6983 Other specified disorders of Eustachian tube, bilateral: Secondary | ICD-10-CM

## 2014-07-16 DIAGNOSIS — Z598 Other problems related to housing and economic circumstances: Secondary | ICD-10-CM

## 2014-07-16 DIAGNOSIS — H6990 Unspecified Eustachian tube disorder, unspecified ear: Secondary | ICD-10-CM | POA: Insufficient documentation

## 2014-07-16 DIAGNOSIS — H6980 Other specified disorders of Eustachian tube, unspecified ear: Secondary | ICD-10-CM | POA: Insufficient documentation

## 2014-07-16 MED ORDER — ESTRADIOL 2 MG PO TABS: ORAL_TABLET | ORAL | Status: DC

## 2014-07-16 NOTE — Progress Notes (Signed)
Pre visit review using our clinic review tool, if applicable. No additional management support is needed unless otherwise documented below in the visit note. 

## 2014-07-16 NOTE — Progress Notes (Signed)
Subjective:    Patient ID: Lauren CanterburyJennia Blando, female    DOB: 08-30-1955, [age] y.o.   MRN: [id number]  HPI  59YO female presents for follow up.  Ear pain - Described as pressure in both ears over 2 months. Occasionally feels dizzy. Sound is muffled. Also has chronic ringing in ears over many years. Occasional sneezing and rhinorrhea with allergies. No fever, chills.  Aside from this, feeling well. Lost her job and is looking for a new job. No insurance right now.  Past medical, surgical, family and social history per today's encounter.  Review of Systems  Constitutional: Negative for fever, chills, appetite change, fatigue and unexpected weight change.  HENT: Positive for congestion, ear pain, postnasal drip and rhinorrhea. Negative for ear discharge, sinus pressure, sore throat, tinnitus, trouble swallowing and voice change.   Eyes: Negative for visual disturbance.  Respiratory: Negative for shortness of breath.   Cardiovascular: Negative for chest pain and leg swelling.  Gastrointestinal: Negative for abdominal pain.  Musculoskeletal: Negative for myalgias and arthralgias.  Skin: Negative for color change and rash.  Hematological: Negative for adenopathy. Does not bruise/bleed easily.  Psychiatric/Behavioral: Negative for sleep disturbance and dysphoric mood. The patient is not nervous/anxious.        Objective:    BP 112/71 mmHg  Pulse 80  Temp(Src) 98 F (36.7 C) (Oral)  Ht 5\' 2"  (1.575 m)  Wt 122 lb 4 oz (55.452 kg)  BMI 22.35 kg/m2  SpO2 97% Physical Exam  Constitutional: She is oriented to person, place, and time. She appears well-developed and well-nourished. No distress.  HENT:  Head: Normocephalic and atraumatic.  Right Ear: Tympanic membrane, external ear and ear canal normal.  Left Ear: Tympanic membrane, external ear and ear canal normal.  Nose: Nose normal.  Mouth/Throat: Oropharynx is clear and moist. No oropharyngeal exudate.  Eyes: Conjunctivae are  normal. Pupils are equal, round, and reactive to light. Right eye exhibits no discharge. Left eye exhibits no discharge. No scleral icterus.  Neck: Normal range of motion. Neck supple. No tracheal deviation present. No thyromegaly present.  Cardiovascular: Normal rate, regular rhythm, normal heart sounds and intact distal pulses.  Exam reveals no gallop and no friction rub.   No murmur heard. Pulmonary/Chest: Effort normal and breath sounds normal. No respiratory distress. She has no wheezes. She has no rales. She exhibits no tenderness.  Musculoskeletal: Normal range of motion. She exhibits no edema or tenderness.  Lymphadenopathy:    She has no cervical adenopathy.  Neurological: She is alert and oriented to person, place, and time. No cranial nerve deficit. She exhibits normal muscle tone. Coordination normal.  Skin: Skin is warm and dry. No rash noted. She is not diaphoretic. No erythema. No pallor.  Psychiatric: She has a normal mood and affect. Her behavior is normal. Judgment and thought content normal.          Assessment & Plan:   Problem List Items Addressed This Visit      Unprioritized   Does not have health insurance    In between jobs and does not have health insurance. Discussed option of Charity care through [employer]. Encouraged her to follow up when she has coverage for routine labs and other health maintenance.      Eustachian tube dysfunction    Symptoms most consistent with eustachian tube dysfunction, likely exacerbated by allergies. Exam is normal. Will increase Prednisone to 20mg  daily x 3 days, then 10mg  daily x 3 days, then back to base of   daily. She will call if any persistent symptoms.      Postmenopausal estrogen deficiency - Primary    Menopausal symptoms well controlled on Estrace. Will continue.          Return if symptoms worsen or fail to improve.

## 2014-07-16 NOTE — Assessment & Plan Note (Signed)
Symptoms most consistent with eustachian tube dysfunction, likely exacerbated by allergies. Exam is normal. Will increase Prednisone to 20mg  daily x 3 days, then 10mg  daily x 3 days, then back to base of 5mg  daily. She will call if any persistent symptoms.

## 2014-07-16 NOTE — Assessment & Plan Note (Signed)
In between jobs and does not have health insurance. Discussed option of Charity care through [employer]. Encouraged her to follow up when she has coverage for routine labs and other health maintenance.

## 2014-07-16 NOTE — Patient Instructions (Signed)
Please call to schedule labs at your convenience.

## 2014-07-16 NOTE — Assessment & Plan Note (Signed)
Menopausal symptoms well controlled on Estrace. Will continue.

## 2014-07-23 ENCOUNTER — Encounter: Payer: Self-pay | Admitting: Internal Medicine

## 2014-07-28 ENCOUNTER — Other Ambulatory Visit (INDEPENDENT_AMBULATORY_CARE_PROVIDER_SITE_OTHER): Payer: Self-pay

## 2014-07-28 ENCOUNTER — Telehealth: Payer: Self-pay | Admitting: Internal Medicine

## 2014-07-28 ENCOUNTER — Other Ambulatory Visit: Payer: Self-pay | Admitting: Internal Medicine

## 2014-07-28 DIAGNOSIS — Z139 Encounter for screening, unspecified: Secondary | ICD-10-CM

## 2014-07-28 NOTE — Telephone Encounter (Signed)
Pt dropped off form to be filled out and a copy of her TB results. Papers in Dr. Tilman NeatWalker's box/msn

## 2014-07-28 NOTE — Addendum Note (Signed)
Addended by: Montine CircleMALDONADO, Shanoah Asbill D on: 07/28/2014 04:00 PM   Modules accepted: Orders

## 2014-07-29 ENCOUNTER — Encounter: Payer: Self-pay | Admitting: Internal Medicine

## 2014-07-29 LAB — MEASLES/MUMPS/RUBELLA IMMUNITY
MUMPS IGG: 5.41 [AU]/ml (ref <9.00)
RUBEOLA IGG: 123 [AU]/ml — AB (ref <25.00)
Rubella: 15.9 Index — ABNORMAL HIGH (ref <0.90)

## 2014-07-29 LAB — HEPATITIS B SURFACE ANTIGEN: Hepatitis B Surface Ag: NEGATIVE

## 2014-07-29 LAB — HEPATITIS B SURFACE ANTIBODY,QUALITATIVE: Hep B S Ab: NEGATIVE

## 2014-07-30 ENCOUNTER — Ambulatory Visit: Payer: Self-pay

## 2014-07-30 ENCOUNTER — Encounter: Payer: Self-pay | Admitting: *Deleted

## 2014-07-30 DIAGNOSIS — Z23 Encounter for immunization: Secondary | ICD-10-CM

## 2014-07-30 MED ORDER — MEASLES, MUMPS & RUBELLA VAC ~~LOC~~ INJ
0.5000 mL | INJECTION | Freq: Once | SUBCUTANEOUS | Status: AC
  Administered 2014-07-30: 0.5 mL via SUBCUTANEOUS

## 2014-07-30 NOTE — Progress Notes (Signed)
Patient received both her MMR and Hep.B 1 round shots today.  See Immunization list for details.  Patient will return for next shots in series and a TB test in the next few weeks.  Patient tolerated well.

## 2014-07-31 ENCOUNTER — Encounter: Payer: Self-pay | Admitting: Internal Medicine

## 2014-07-31 NOTE — Telephone Encounter (Signed)
Pt came yesterday for immunizations and picked up forms from [place]

## 2014-08-01 ENCOUNTER — Encounter: Payer: Self-pay | Admitting: Internal Medicine

## 2014-08-01 ENCOUNTER — Other Ambulatory Visit: Payer: Self-pay | Admitting: Internal Medicine

## 2014-08-01 ENCOUNTER — Other Ambulatory Visit (INDEPENDENT_AMBULATORY_CARE_PROVIDER_SITE_OTHER): Payer: Self-pay

## 2014-08-01 DIAGNOSIS — Z139 Encounter for screening, unspecified: Secondary | ICD-10-CM

## 2014-08-04 LAB — VARICELLA ZOSTER ABS, IGG/IGM
Varicella IgM: <0.91 index (ref 0.00–0.90)
Varicella zoster IgG: 361 index (ref >165)

## 2014-08-05 ENCOUNTER — Ambulatory Visit: Payer: Self-pay

## 2014-08-05 DIAGNOSIS — Z111 Encounter for screening for respiratory tuberculosis: Secondary | ICD-10-CM

## 2014-08-05 NOTE — Progress Notes (Signed)
Patient came in for TB skin test.  Placed PPD in Right Arm.  Patient verbalized understanding to return on Thursday for PPD reading.  Provided patient with Copy of her Varicella Titer.

## 2014-08-07 ENCOUNTER — Encounter: Payer: Self-pay | Admitting: *Deleted

## 2014-08-07 ENCOUNTER — Ambulatory Visit: Payer: Self-pay

## 2014-08-07 DIAGNOSIS — Z111 Encounter for screening for respiratory tuberculosis: Secondary | ICD-10-CM

## 2014-08-07 NOTE — Progress Notes (Signed)
Patient came in for PPD reading.  Right arm, no induration or redness noted.  Documented results. Copy given to patient.

## 2014-08-11 ENCOUNTER — Encounter: Payer: Self-pay | Admitting: *Deleted

## 2014-08-11 ENCOUNTER — Encounter: Payer: Self-pay | Admitting: Internal Medicine

## 2014-08-13 ENCOUNTER — Other Ambulatory Visit: Payer: Self-pay | Admitting: Internal Medicine

## 2014-08-13 NOTE — Telephone Encounter (Signed)
Last OV 5.25.16.  Please advise refill 

## 2014-08-18 ENCOUNTER — Ambulatory Visit: Payer: Self-pay

## 2014-08-21 ENCOUNTER — Ambulatory Visit: Payer: Self-pay | Admitting: Physical Therapy

## 2014-08-27 ENCOUNTER — Other Ambulatory Visit: Payer: Self-pay | Admitting: Internal Medicine

## 2014-09-01 ENCOUNTER — Ambulatory Visit: Payer: Self-pay

## 2014-09-01 DIAGNOSIS — Z23 Encounter for immunization: Secondary | ICD-10-CM

## 2014-09-01 MED ORDER — HEPATITIS B VAC RECOMBINANT 10 MCG/ML IJ SUSP
1.0000 mL | Freq: Once | INTRAMUSCULAR | Status: AC
  Administered 2014-09-01: 10 ug via INTRAMUSCULAR

## 2014-09-01 MED ORDER — MEASLES, MUMPS & RUBELLA VAC ~~LOC~~ INJ
0.5000 mL | INJECTION | Freq: Once | SUBCUTANEOUS | Status: AC
  Administered 2014-09-01: 0.5 mL via SUBCUTANEOUS

## 2014-10-21 ENCOUNTER — Encounter: Payer: Self-pay | Admitting: Internal Medicine

## 2014-10-22 MED ORDER — LINACLOTIDE 290 MCG PO CAPS: 290.0000 ug | ORAL_CAPSULE | Freq: Every day | ORAL | Status: DC

## 2014-11-06 ENCOUNTER — Encounter: Payer: Self-pay | Admitting: Internal Medicine

## 2014-11-07 ENCOUNTER — Other Ambulatory Visit: Payer: Self-pay | Admitting: *Deleted

## 2014-11-07 MED ORDER — LINACLOTIDE 290 MCG PO CAPS: 290.0000 ug | ORAL_CAPSULE | Freq: Every day | ORAL | Status: DC

## 2014-12-29 ENCOUNTER — Encounter: Payer: Self-pay | Admitting: Internal Medicine

## 2015-04-03 ENCOUNTER — Encounter: Payer: Self-pay | Admitting: Internal Medicine

## 2015-12-10 ENCOUNTER — Ambulatory Visit (INDEPENDENT_AMBULATORY_CARE_PROVIDER_SITE_OTHER): Payer: BLUE CROSS/BLUE SHIELD | Admitting: Family

## 2015-12-10 ENCOUNTER — Encounter: Payer: Self-pay | Admitting: Family

## 2015-12-10 VITALS — BP 138/70 | HR 73 | Temp 98.0°F | Ht 62.0 in | Wt 126.8 lb

## 2015-12-10 DIAGNOSIS — Z7689 Persons encountering health services in other specified circumstances: Secondary | ICD-10-CM | POA: Diagnosis not present

## 2015-12-10 DIAGNOSIS — Z780 Asymptomatic menopausal state: Secondary | ICD-10-CM

## 2015-12-10 DIAGNOSIS — H6993 Unspecified Eustachian tube disorder, bilateral: Secondary | ICD-10-CM

## 2015-12-10 DIAGNOSIS — Z8619 Personal history of other infectious and parasitic diseases: Secondary | ICD-10-CM

## 2015-12-10 DIAGNOSIS — H6983 Other specified disorders of Eustachian tube, bilateral: Secondary | ICD-10-CM | POA: Diagnosis not present

## 2015-12-10 DIAGNOSIS — I059 Rheumatic mitral valve disease, unspecified: Secondary | ICD-10-CM

## 2015-12-10 LAB — COMPREHENSIVE METABOLIC PANEL
ALT: 24 U/L (ref 0–35)
AST: 24 U/L (ref 0–37)
Albumin: 4.4 g/dL (ref 3.5–5.2)
Alkaline Phosphatase: 43 U/L (ref 39–117)
BUN: 9 mg/dL (ref 6–23)
CO2: 27 meq/L (ref 19–32)
Calcium: 9.4 mg/dL (ref 8.4–10.5)
Chloride: 102 mEq/L (ref 96–112)
Creatinine, Ser: 0.74 mg/dL (ref 0.40–1.20)
GFR: 84.53 mL/min (ref >60.00)
GLUCOSE: 85 mg/dL (ref 70–99)
POTASSIUM: 4.3 meq/L (ref 3.5–5.1)
Sodium: 136 mEq/L (ref 135–145)
Total Bilirubin: 0.4 mg/dL (ref 0.2–1.2)
Total Protein: 7.3 g/dL (ref 6.0–8.3)

## 2015-12-10 LAB — CBC WITH DIFFERENTIAL/PLATELET
BASOS ABS: 0.1 10*3/uL (ref 0.0–0.1)
Basophils Relative: 0.8 % (ref 0.0–3.0)
Eosinophils Absolute: 0.3 10*3/uL (ref 0.0–0.7)
Eosinophils Relative: 4.4 % (ref 0.0–5.0)
HCT: 38.8 % (ref 36.0–46.0)
Hemoglobin: 13.2 g/dL (ref 12.0–15.0)
LYMPHS ABS: 2 10*3/uL (ref 0.7–4.0)
Lymphocytes Relative: 29.6 % (ref 12.0–46.0)
MCHC: 34.1 g/dL (ref 30.0–36.0)
MCV: 92.5 fl (ref 78.0–100.0)
MONO ABS: 0.6 10*3/uL (ref 0.1–1.0)
Monocytes Relative: 9 % (ref 3.0–12.0)
NEUTROS PCT: 56.2 % (ref 43.0–77.0)
Neutro Abs: 3.8 10*3/uL (ref 1.4–7.7)
Platelets: 245 10*3/uL (ref 150.0–400.0)
RBC: 4.19 Mil/uL (ref 3.87–5.11)
RDW: 13.6 % (ref 11.5–15.5)
WBC: 6.8 10*3/uL (ref 4.0–10.5)

## 2015-12-10 LAB — LIPID PANEL
Cholesterol: 193 mg/dL (ref 0–200)
HDL: 71.3 mg/dL (ref >39.00)
LDL CALC: 87 mg/dL (ref 0–99)
NonHDL: 122.15
TRIGLYCERIDES: 176 mg/dL — AB (ref 0.0–149.0)
Total CHOL/HDL Ratio: 3
VLDL: 35.2 mg/dL (ref 0.0–40.0)

## 2015-12-10 LAB — VITAMIN D 25 HYDROXY (VIT D DEFICIENCY, FRACTURES): VITD: 30.14 ng/mL (ref 30.00–100.00)

## 2015-12-10 LAB — HEMOGLOBIN A1C: Hgb A1c MFr Bld: 5.3 % (ref 4.6–6.5)

## 2015-12-10 LAB — TSH: TSH: 2.88 u[IU]/mL (ref 0.35–4.50)

## 2015-12-10 MED ORDER — ESTRADIOL 2 MG PO TABS: ORAL_TABLET | ORAL | 3 refills | Status: DC

## 2015-12-10 MED ORDER — VALACYCLOVIR HCL 500 MG PO TABS: 500.0000 mg | ORAL_TABLET | Freq: Two times a day (BID) | ORAL | 3 refills | Status: AC

## 2015-12-10 NOTE — Assessment & Plan Note (Signed)
Chronic.Afebrile. Benign exam. Advised OTC flonase.

## 2015-12-10 NOTE — Progress Notes (Signed)
Subjective:    Patient ID: Lauren Whitaker, female    DOB: 05-Aug-1955, 60 y.o.   MRN: [id number]  CC: Lauren Whitaker is a 60 y.o. female who presents today for follow up.   HPI: Patient here for follow-up for medication refill.  She also complains of left sided sinus pressure for past couple of weeks. Chronic however noted HA 'between eyes' and noticed bleeding from left nostril. Takes sudafed which helps. HA is mild. Congestion of left nose. No fever, cough, sore throat. Has tried flonase in the past with some relief. H/o seasonal allergies.   Has been on Estrace since 1993 when had total hysterectomy. Has tried lower dose and had terrible hot flashes. Takes half tablet every day. No h/o CVD, HTN.   Had echocardiogram several years ago with dr Mariah Milling for heart murmur since childbirth.   Denies exertional chest pain or pressure, numbness or tingling radiating to left arm or jaw, palpitations, dizziness, changes in vision, or shortness of breath.        HISTORY:  Past Medical History:  Diagnosis Date  . Cat allergies   . Cervical herniated disc   . Chronic joint pain   . Constipation   . Fatigue   . Herpes genitalis   . Iron deficiency anemia   . Mitral valve prolapse   . Stomach disorder   . Syncope   . Vitamin B12 deficiency    Past Surgical History:  Procedure Laterality Date  . ABDOMINAL HYSTERECTOMY  1992  . bladder tact    . herniated disc    . hysterectomy (other)    . join replacement in left foot    . plate and screws in left hand    . repair to former Scientist, clinical (histocompatibility and immunogenetics)    . retrocycle repair    . TONSILLECTOMY AND ADENOIDECTOMY     Family History  Problem Relation Age of Onset  . Rheumatic fever Father     when he was young  . Heart attack Father   . ALS Brother   . Dementia Mother 54  . Lymphoma Mother   . Cancer - Other Maternal Grandfather     Allergies: Boniva [ibandronate sodium]; Contrast media [iodinated diagnostic agents]; and Influenza  vaccines No current outpatient prescriptions on file prior to visit.   No current facility-administered medications on file prior to visit.     Social History  Substance Use Topics  . Smoking status: Never Smoker  . Smokeless tobacco: Never Used     Comment: tobacco use- no   . Alcohol use No    Review of Systems  Constitutional: Negative for chills and fever.  HENT: Positive for rhinorrhea and sinus pressure. Negative for sore throat.   Eyes: Negative for visual disturbance.  Respiratory: Negative for cough.   Cardiovascular: Negative for chest pain and palpitations.  Gastrointestinal: Negative for nausea and vomiting.  Neurological: Positive for headaches. Negative for dizziness.      Objective:    BP 138/70   Pulse 73   Temp 98 F (36.7 C) (Oral)   Ht 5\' 2"  (1.575 m)   Wt 126 lb 12.8 oz (57.5 kg)   SpO2 98%   BMI 23.19 kg/m  BP Readings from Last 3 Encounters:  12/10/15 138/70  07/16/14 112/71  04/22/13 (!) 121/54   Wt Readings from Last 3 Encounters:  12/10/15 126 lb 12.8 oz (57.5 kg)  07/16/14 122 lb 4 oz (55.5 kg)  04/22/13 118 lb (53.5 kg)  Physical Exam  Constitutional: She appears well-developed and well-nourished.  HENT:  Head: Normocephalic and atraumatic.  Right Ear: Hearing, tympanic membrane, external ear and ear canal normal. No drainage, swelling or tenderness. No foreign bodies. Tympanic membrane is not erythematous and not bulging. No middle ear effusion. No decreased hearing is noted.  Left Ear: Hearing, tympanic membrane, external ear and ear canal normal. No drainage, swelling or tenderness. No foreign bodies. Tympanic membrane is not erythematous and not bulging.  No middle ear effusion. No decreased hearing is noted.  Nose: Nose normal. No rhinorrhea. Right sinus exhibits no maxillary sinus tenderness and no frontal sinus tenderness. Left sinus exhibits no maxillary sinus tenderness and no frontal sinus tenderness.  Mouth/Throat: Uvula is  midline, oropharynx is clear and moist and mucous membranes are normal. No oropharyngeal exudate, posterior oropharyngeal edema, posterior oropharyngeal erythema or tonsillar abscesses.  Eyes: Conjunctivae are normal.  Cardiovascular: Regular rhythm, normal heart sounds and normal pulses.   Pulmonary/Chest: Effort normal and breath sounds normal. She has no wheezes. She has no rhonchi. She has no rales.  Lymphadenopathy:       Head (right side): No submental, no submandibular, no tonsillar, no preauricular, no posterior auricular and no occipital adenopathy present.       Head (left side): No submental, no submandibular, no tonsillar, no preauricular, no posterior auricular and no occipital adenopathy present.    She has no cervical adenopathy.  Neurological: She is alert.  Skin: Skin is warm and dry.  Psychiatric: She has a normal mood and affect. Her speech is normal and behavior is normal. Thought content normal.  Vitals reviewed.      Assessment & Plan:   Problem List Items Addressed This Visit      Nervous and Auditory   Eustachian tube dysfunction    Chronic.Afebrile. Benign exam. Advised OTC flonase.         Other   MITRAL VALVE PROLAPSE    No murmur on exam. Asymptomatic. Will follow.       Postmenopausal estrogen deficiency    Discussed risks of medication including CVD, breast cancer. Patient aware and declined decreasing dose at this time. Ordered mammogram as due. Refilled medication.       Encounter to establish care - Primary    Reviewed past medical, social, and family history with patient.       Relevant Medications   estradiol (ESTRACE) 2 MG tablet   Other Relevant Orders   CBC with Differential/Platelet   Comprehensive metabolic panel   Hemoglobin A1c   Lipid panel   TSH   VITAMIN D 25 Hydroxy (Vit-D Deficiency, Fractures)   Hepatitis C antibody   HIV antibody   MM DIGITAL SCREENING BILATERAL   History of cold sores    No active lesions; refilled  medication if patient needs.       Relevant Medications   valACYclovir (VALTREX) 500 MG tablet    Other Visit Diagnoses   None.      I have discontinued Ms. Nurse's predniSONE, (measles, mumps and rubella vaccine), and linaclotide. I am also having her maintain her estradiol and valACYclovir.   Meds ordered this encounter  Medications  . estradiol (ESTRACE) 2 MG tablet    Sig: TAKE 1 TABLET BY MOUTH EVERY DAY    Dispense:  90 tablet    Refill:  3    Order Specific Question:   Supervising Provider    Answer:   Duncan Dull L [[id number]]  . valACYclovir (VALTREX) 500  MG tablet    Sig: Take 1 tablet (500 mg total) by mouth 2 (two) times daily.    Dispense:  30 tablet    Refill:  3    Order Specific Question:   Supervising Provider    Answer:   Sherlene ShamsULLO, TERESA L [[id number]]    Return precautions given.   Risks, benefits, and alternatives of the medications and treatment plan prescribed today were discussed, and patient expressed understanding.   Education regarding symptom management and diagnosis given to patient on AVS.  Continue to follow with Rennie PlowmanMargaret Arnett, FNP for routine health maintenance.   Areta HaberJennia Gabay and I agreed with plan.   Rennie PlowmanMargaret Arnett, FNP

## 2015-12-10 NOTE — Assessment & Plan Note (Signed)
No active lesions; refilled medication if patient needs.

## 2015-12-10 NOTE — Assessment & Plan Note (Signed)
Reviewed past medical, social, and family history with patient.

## 2015-12-10 NOTE — Assessment & Plan Note (Signed)
Discussed risks of medication including CVD, breast cancer. Patient aware and declined decreasing dose at this time. Ordered mammogram as due. Refilled medication.

## 2015-12-10 NOTE — Patient Instructions (Addendum)
Pleasure meeting you today.  Physical and please make this appointment for physical and fasting screening labs for your next appointment.  Plenty of water.  OTC Flonase.   We will continue to monitor murmur and consider repeat echocardiogram.

## 2015-12-10 NOTE — Progress Notes (Signed)
Pre visit review using our clinic review tool, if applicable. No additional management support is needed unless otherwise documented below in the visit note. 

## 2015-12-10 NOTE — Assessment & Plan Note (Signed)
No murmur on exam. Asymptomatic. Will follow.

## 2015-12-11 ENCOUNTER — Encounter: Payer: Self-pay | Admitting: Family

## 2015-12-11 LAB — HEPATITIS C ANTIBODY: HCV Ab: NEGATIVE

## 2015-12-11 LAB — HIV ANTIBODY (ROUTINE TESTING W REFLEX): HIV 1&2 Ab, 4th Generation: NONREACTIVE

## 2016-01-12 ENCOUNTER — Encounter: Payer: BLUE CROSS/BLUE SHIELD | Admitting: Family

## 2016-01-13 ENCOUNTER — Ambulatory Visit: Payer: BLUE CROSS/BLUE SHIELD | Attending: Family

## 2016-01-17 ENCOUNTER — Encounter: Payer: Self-pay | Admitting: Family

## 2016-12-13 ENCOUNTER — Other Ambulatory Visit: Payer: Self-pay | Admitting: Family

## 2016-12-13 DIAGNOSIS — Z7689 Persons encountering health services in other specified circumstances: Secondary | ICD-10-CM

## 2017-04-25 ENCOUNTER — Telehealth: Payer: Self-pay | Admitting: Family

## 2017-04-25 NOTE — Telephone Encounter (Signed)
Please mail letter-   Hope you are well.   In reviewing your chart, it appears your are due for annual mammogram.  Please let us know if you would like for me to order. You may call the office to let us know, and we will order for you.   Once the order in the system, you may schedule at your preferred location.   Typically women have been using one of the sites below however you may go where you have been in the past. I would ensure that when you do get a mammogram that it is 3D ( as opposed to 2D which was prior technology). Evidence suggests that 3D is superior.   Please note that NOT all insurance companies cover 3D, and you may have to pay a higher copay. You may call your insurance company to further clarify your benefits.   Options for Mammogram in [place]:    [facility]  [address]  [place], [place]  [phone]   [facility] [address] , [place] [phone]   Let us know if you have questions.   My best,   Margaret Arnett, NP   

## 2017-05-05 ENCOUNTER — Encounter: Payer: Self-pay | Admitting: Family

## 2017-05-05 NOTE — Telephone Encounter (Signed)
mychart message sent today.

## 2017-05-19 ENCOUNTER — Telehealth: Payer: Self-pay | Admitting: Family

## 2017-05-19 NOTE — Telephone Encounter (Signed)
Unread mychart  Ms Spikes,  [facility]    In reviewing your chart, it appears your are due for annual mammogram.    Please let us know if you would like for me to order. You may call the office to let us know, and we will order for you.    Once the order in the system, you may schedule at your preferred location.    Typically women have been using one of the sites below however you may go where you have been in the past. I would ensure that when you do get a mammogram that it is 3D ( as opposed to 2D which was prior technology). Evidence suggests that 3D is superior.    Please note that NOT all insurance companies cover 3D,and you may have to pay a higher copay. You may call your insurance company to further clarify your benefits.    Options for Mammogramin :    [facility]  [address]  [place], [place]  [phone]    [facility]  [address]  [place], [place]  [phone]      Let us know if you have questions.    My best,    Rennie PlowmanMargaret Arnett, NP

## 2017-05-23 NOTE — Telephone Encounter (Signed)
Letter sent.

## 2017-06-21 ENCOUNTER — Other Ambulatory Visit: Payer: Self-pay | Admitting: Family

## 2017-06-21 DIAGNOSIS — Z7689 Persons encountering health services in other specified circumstances: Secondary | ICD-10-CM

## 2017-06-22 ENCOUNTER — Encounter: Payer: Self-pay | Admitting: Family

## 2017-06-23 ENCOUNTER — Encounter: Payer: Self-pay | Admitting: Family

## 2017-06-23 NOTE — Telephone Encounter (Signed)
Call pt  havent seen her in 2 years. Will refill out of courtesy however no further refill until seen. 90 day supply She is also overdue for preventative care- mammogram and pap. Strongly advise appointment for follow up

## 2017-06-23 NOTE — Telephone Encounter (Signed)
Last office visit 12/10/15 Last filled 03/14/17 No office visit scheduled

## 2017-07-20 NOTE — Telephone Encounter (Signed)
Mychart message sent.

## 2017-07-20 NOTE — Telephone Encounter (Signed)
Left voice mail for patient to call back ok for PEC to speak to patient    

## 2017-10-01 ENCOUNTER — Other Ambulatory Visit: Payer: Self-pay | Admitting: Family

## 2017-10-01 DIAGNOSIS — Z7689 Persons encountering health services in other specified circumstances: Secondary | ICD-10-CM

## 2017-10-05 ENCOUNTER — Other Ambulatory Visit: Payer: Self-pay | Admitting: Family

## 2017-10-05 DIAGNOSIS — Z7689 Persons encountering health services in other specified circumstances: Secondary | ICD-10-CM

## 2017-10-06 NOTE — Telephone Encounter (Signed)
Call patient I have refilled her Estrace for 90 days  Please advise patient that she is to make a follow-up appointment with me since I have not seen her since 2017 when she established care.  If I am to prescribe medications, I need to see her annually.    Last refill until she has an appointment.

## 2017-10-06 NOTE — Telephone Encounter (Signed)
Appointment scheduled.

## 2017-10-06 NOTE — Telephone Encounter (Signed)
Last office visit 12/11/15 No office visit scheduled Last refill 06/23/17

## 2017-11-13 ENCOUNTER — Ambulatory Visit: Payer: BLUE CROSS/BLUE SHIELD | Admitting: Family

## 2017-12-30 ENCOUNTER — Other Ambulatory Visit: Payer: Self-pay | Admitting: Family

## 2017-12-30 DIAGNOSIS — Z7689 Persons encountering health services in other specified circumstances: Secondary | ICD-10-CM

## 2018-01-01 NOTE — Telephone Encounter (Signed)
Call pt Is she still a patient here?   We have a refill request for estrace however she hasnt been seen in 2 years. She will need an appt for medication refills.

## 2018-01-02 NOTE — Telephone Encounter (Signed)
Left voice mail for patient to call back ok for PEC to speak to patient  Left voicemail for patient to call ,see below message needs to schedule appointment for medication refill

## 2018-01-22 NOTE — Telephone Encounter (Signed)
Noted  

## 2019-12-12 ENCOUNTER — Other Ambulatory Visit: Payer: Self-pay | Admitting: Family Medicine

## 2019-12-12 DIAGNOSIS — Z1231 Encounter for screening mammogram for malignant neoplasm of breast: Secondary | ICD-10-CM

## 2020-01-20 ENCOUNTER — Ambulatory Visit
Admission: RE | Admit: 2020-01-20 | Discharge: 2020-01-20 | Disposition: A | Discharge status: Home / Self Care | Payer: BC Managed Care – PPO | Source: Ambulatory Visit | Attending: Family Medicine | Admitting: Family Medicine

## 2020-01-20 ENCOUNTER — Other Ambulatory Visit: Payer: Self-pay

## 2020-01-20 DIAGNOSIS — Z1231 Encounter for screening mammogram for malignant neoplasm of breast: Secondary | ICD-10-CM | POA: Diagnosis not present

## 2020-07-09 ENCOUNTER — Ambulatory Visit: Payer: BC Managed Care – PPO | Admitting: Cardiology

## 2021-05-06 ENCOUNTER — Other Ambulatory Visit: Payer: Self-pay | Admitting: Family Medicine

## 2021-06-14 ENCOUNTER — Ambulatory Visit
Admission: RE | Admit: 2021-06-14 | Discharge: 2021-06-14 | Disposition: A | Discharge status: Home / Self Care | Payer: Medicare Other | Source: Ambulatory Visit | Attending: Family Medicine | Admitting: Family Medicine

## 2021-06-14 DIAGNOSIS — Z1231 Encounter for screening mammogram for malignant neoplasm of breast: Secondary | ICD-10-CM | POA: Insufficient documentation

## 2021-06-15 ENCOUNTER — Other Ambulatory Visit: Payer: Self-pay | Admitting: Family

## 2021-06-15 DIAGNOSIS — Z7689 Persons encountering health services in other specified circumstances: Secondary | ICD-10-CM

## 2021-11-24 ENCOUNTER — Encounter: Payer: Self-pay | Admitting: Podiatry

## 2021-11-24 ENCOUNTER — Ambulatory Visit (INDEPENDENT_AMBULATORY_CARE_PROVIDER_SITE_OTHER): Payer: Medicare Other | Admitting: Podiatry

## 2021-11-24 ENCOUNTER — Ambulatory Visit (INDEPENDENT_AMBULATORY_CARE_PROVIDER_SITE_OTHER): Payer: Medicare Other

## 2021-11-24 DIAGNOSIS — M7752 Other enthesopathy of left foot: Secondary | ICD-10-CM

## 2021-11-24 DIAGNOSIS — M778 Other enthesopathies, not elsewhere classified: Secondary | ICD-10-CM | POA: Diagnosis not present

## 2021-11-24 DIAGNOSIS — M7750 Other enthesopathy of unspecified foot: Secondary | ICD-10-CM

## 2021-11-24 NOTE — Progress Notes (Signed)
Subjective:  Patient ID: Lauren Whitaker, female    DOB: 08-15-1955,  MRN: [id number] HPI Chief Complaint  Patient presents with   Foot Pain    Plantar foot and ankle (medial and lateral) bilateral - weakness and instability in ankle (R>L), ortho checked plantar foot said she had decreased padding, also joint replacement done in left   New Patient (Initial Visit)    66 y.o. female presents with the above complaint.   ROS: Denies fever chills nausea vomiting muscle aches pains calf pain back pain chest pain shortness of breath.  Feels that her foot becomes bound in the middle then pops experiences excruciating pain in the midfoot and feels that her ankle is unstable and it rolls laterally but the pain is medially.  Past Medical History:  Diagnosis Date   Cat allergies    Cervical herniated disc    Chronic joint pain    Constipation    Fatigue    Herpes genitalis    Iron deficiency anemia    Mitral valve prolapse    Stomach disorder    Syncope    Vitamin B12 deficiency    Past Surgical History:  Procedure Laterality Date   ABDOMINAL HYSTERECTOMY  1992   bladder tact     herniated disc     hysterectomy (other)     join replacement in left foot     plate and screws in left hand     repair to former retrocyte repair     retrocycle repair     TONSILLECTOMY AND ADENOIDECTOMY      Current Outpatient Medications:    estradiol (ESTRACE) 2 MG tablet, TAKE 1 TABLET EVERY DAY, Disp: 90 tablet, Rfl: 0  Allergies  Allergen Reactions   Boniva [Ibandronate Sodium]     Endocrine and Metabolic agents   Contrast Media [Iodinated Contrast Media]     Hypotension, swelling under eyes, HA, shaking chills following IV contrast for CT   Influenza Vaccines     Dyspnea with pleurisy   Review of Systems Objective:  There were no vitals filed for this visit.  General: Well developed, nourished, in no acute distress, alert and oriented x3   Dermatological: Skin is warm, dry and  supple bilateral. Nails x 10 are well maintained; remaining integument appears unremarkable at this time. There are no open sores, no preulcerative lesions, no rash or signs of infection present.  Vascular: Dorsalis Pedis artery and Posterior Tibial artery pedal pulses are 2/4 bilateral with immedate capillary fill time. Pedal hair growth present. No varicosities and no lower extremity edema present bilateral.   Neruologic: Grossly intact via light touch bilateral. Vibratory intact via tuning fork bilateral. Protective threshold with Semmes Wienstein monofilament intact to all pedal sites bilateral. Patellar and Achilles deep tendon reflexes 2+ bilateral. No Babinski or clonus noted bilateral.   Musculoskeletal: No gross boney pedal deformities bilateral. No pain, crepitus, or limitation noted with foot and ankle range of motion bilateral. Muscular strength 5/5 in all groups tested bilateral.  Only reproducible pain is pain on palpation and frontal plane range of motion in the midfoot.  Gait: Unassisted, Nonantalgic.    Radiographs:  Radiographs of the right foot taken today demonstrate an osseously mature foot.  She has some joint space narrowing of the gutter of the ankle laterally.  Medial looks good she does have some mild to moderate osteopenia.  She also has some joint space narrowing of the midfoot with even a possible dislocating or dislocatable middle cuneiform.  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  Encouraged her to purchase a pair of matrix insoles.  Should this fail to alleviate her symptoms we will consider an MRI or CT scan of that midfoot right.  She may need a custom pair of orthotics.     Jerl Munyan T. [place], [place]

## 2022-04-14 DIAGNOSIS — R399 Unspecified symptoms and signs involving the genitourinary system: Secondary | ICD-10-CM | POA: Diagnosis not present

## 2022-05-20 DIAGNOSIS — H538 Other visual disturbances: Secondary | ICD-10-CM | POA: Diagnosis not present

## 2022-05-20 DIAGNOSIS — H59099 Other disorders of unspecified eye following cataract surgery: Secondary | ICD-10-CM | POA: Diagnosis not present

## 2022-05-26 ENCOUNTER — Other Ambulatory Visit: Payer: Self-pay | Admitting: Family Medicine

## 2022-05-26 DIAGNOSIS — Z1231 Encounter for screening mammogram for malignant neoplasm of breast: Secondary | ICD-10-CM

## 2022-05-31 DIAGNOSIS — M1812 Unilateral primary osteoarthritis of first carpometacarpal joint, left hand: Secondary | ICD-10-CM | POA: Diagnosis not present

## 2022-05-31 DIAGNOSIS — M65321 Trigger finger, right index finger: Secondary | ICD-10-CM | POA: Diagnosis not present

## 2022-05-31 IMAGING — MG MM DIGITAL SCREENING BILAT W/ TOMO AND CAD
8 series · 9 of 24 positions shown · non-contrast
Comparison: Previous exam(s).

CLINICAL DATA: Screening.

EXAM:
DIGITAL SCREENING BILATERAL MAMMOGRAM WITH TOMOSYNTHESIS AND CAD
TECHNIQUE: Bilateral screening digital craniocaudal and mediolateral oblique
mammograms were obtained. Bilateral screening digital breast
tomosynthesis was performed. The images were evaluated with
computer-aided detection.

[R CC synth-2D]
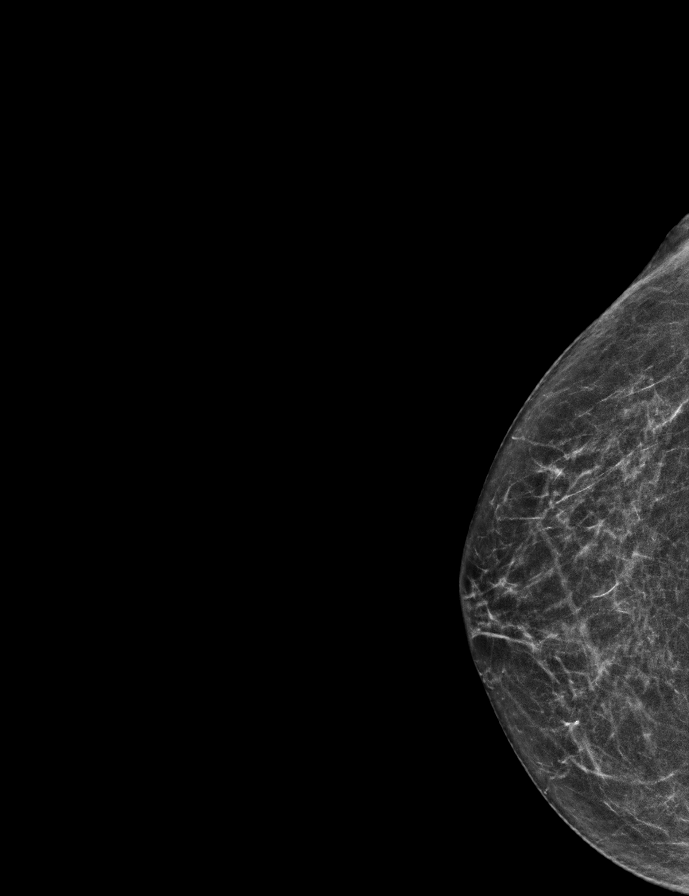

[L MLO synth-2D]
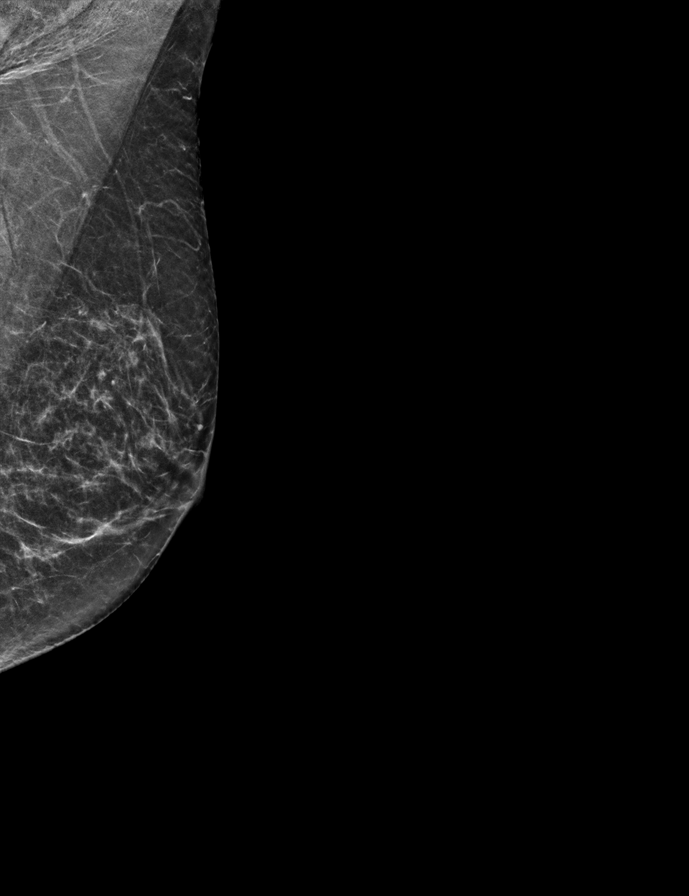

[R MLO synth-2D]
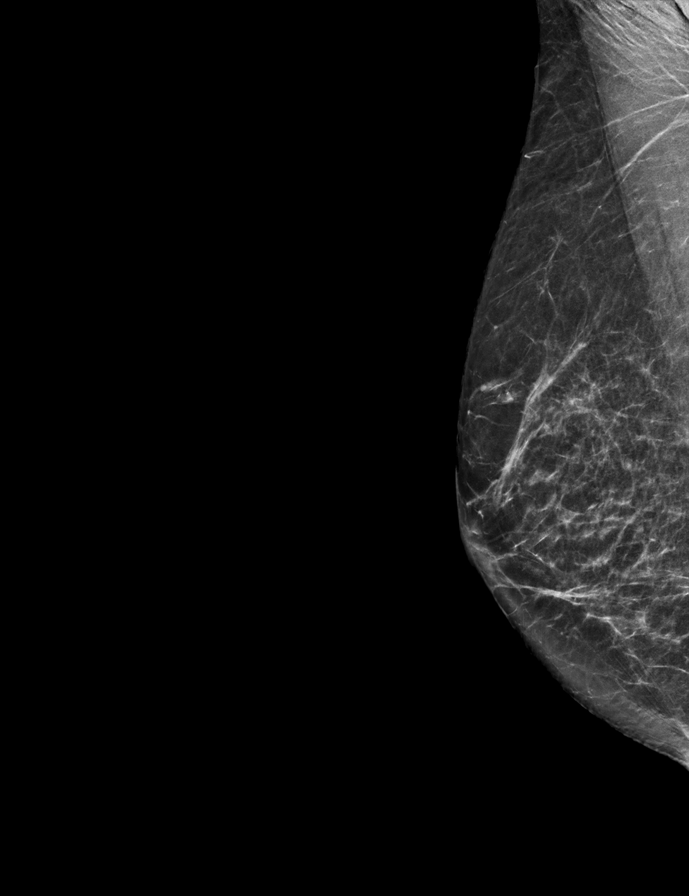

[L CC synth-2D]
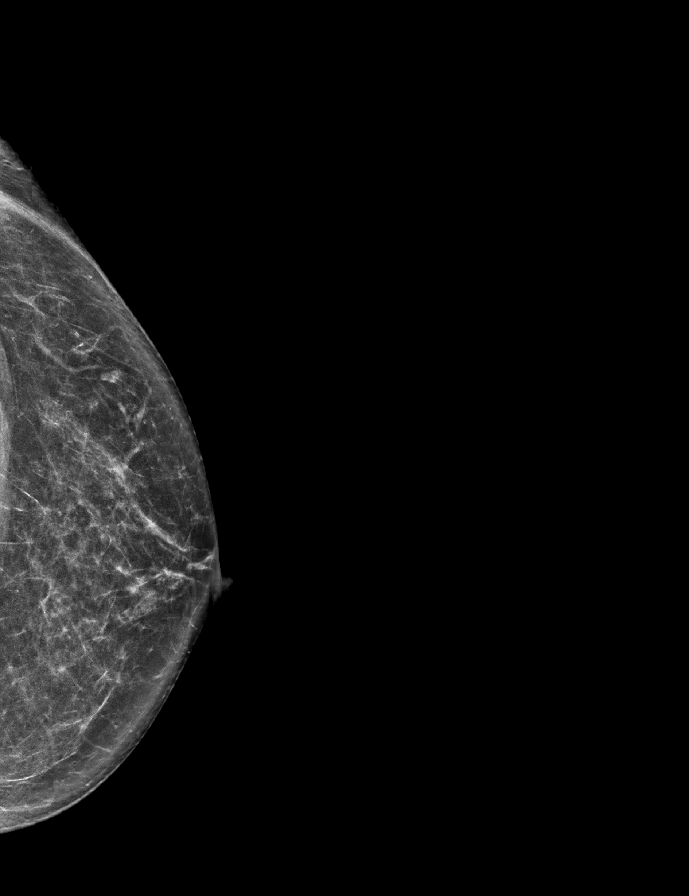

[R MLO tomo · 2 of 63 frames shown]
[frame 21/63]
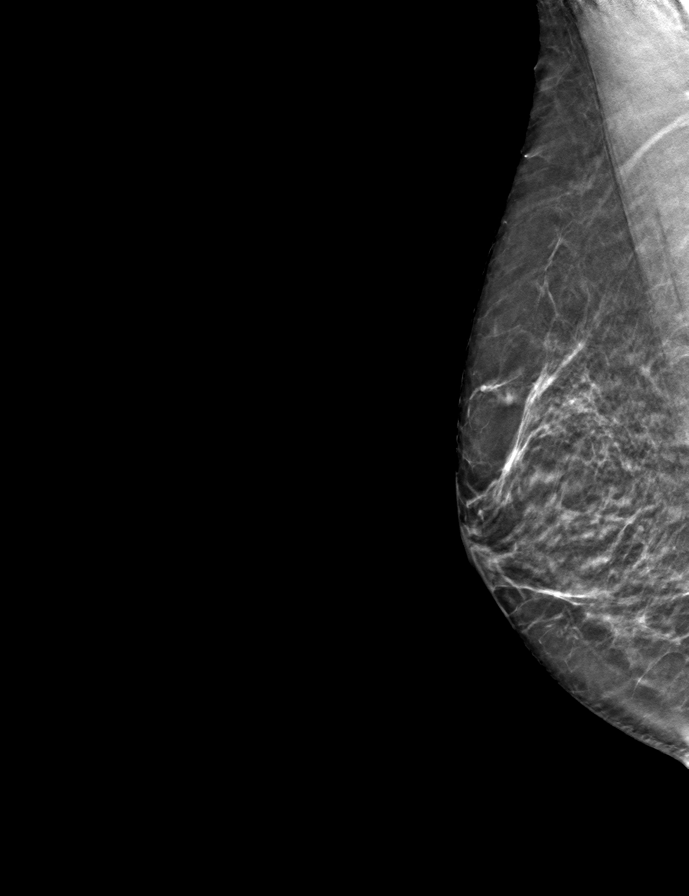
[frame 32/63]
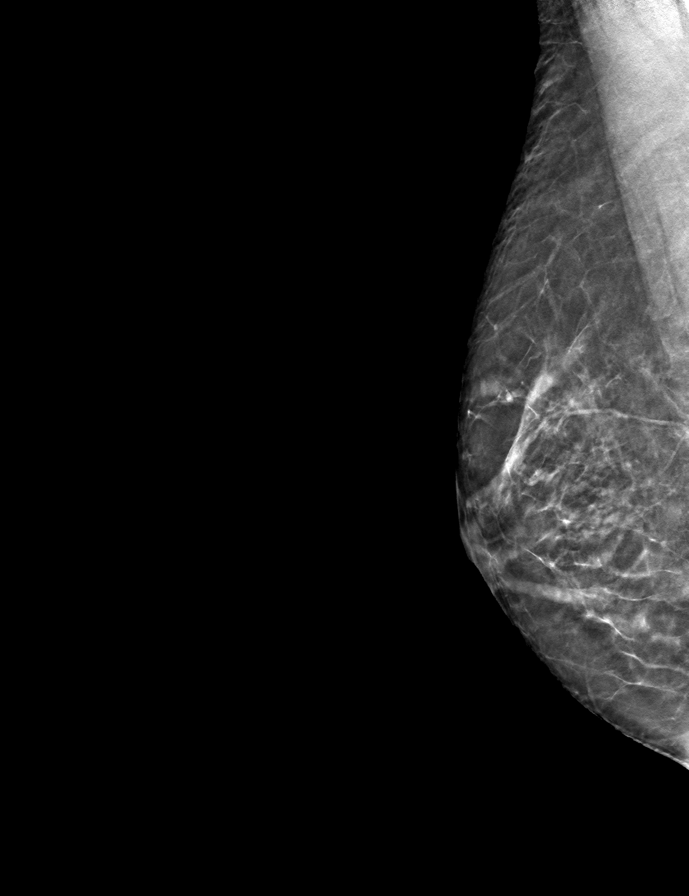

[L MLO tomo · tomo slice 27/52.0]
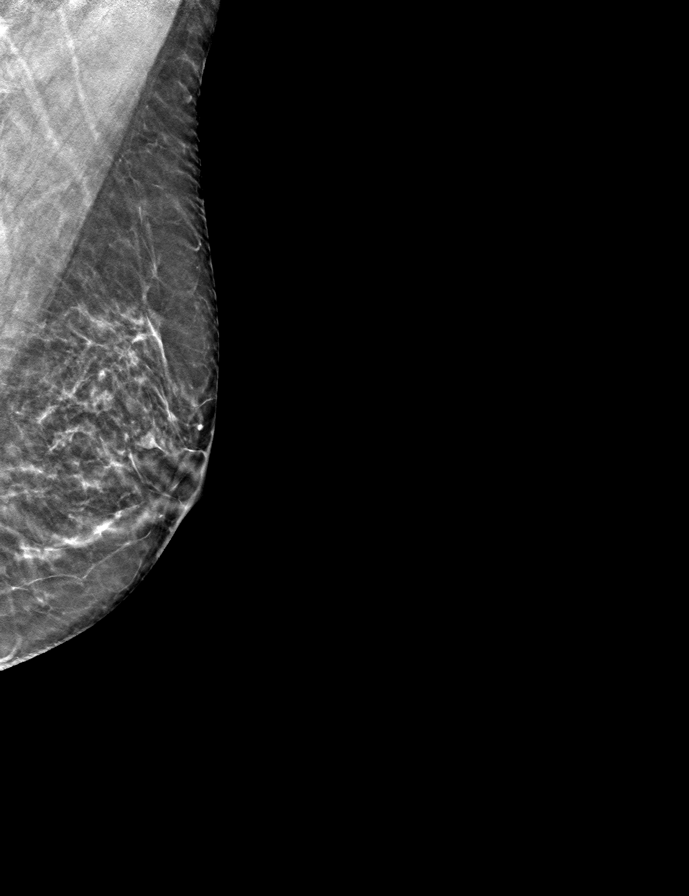

[L CC tomo · tomo slice 33/64.0]
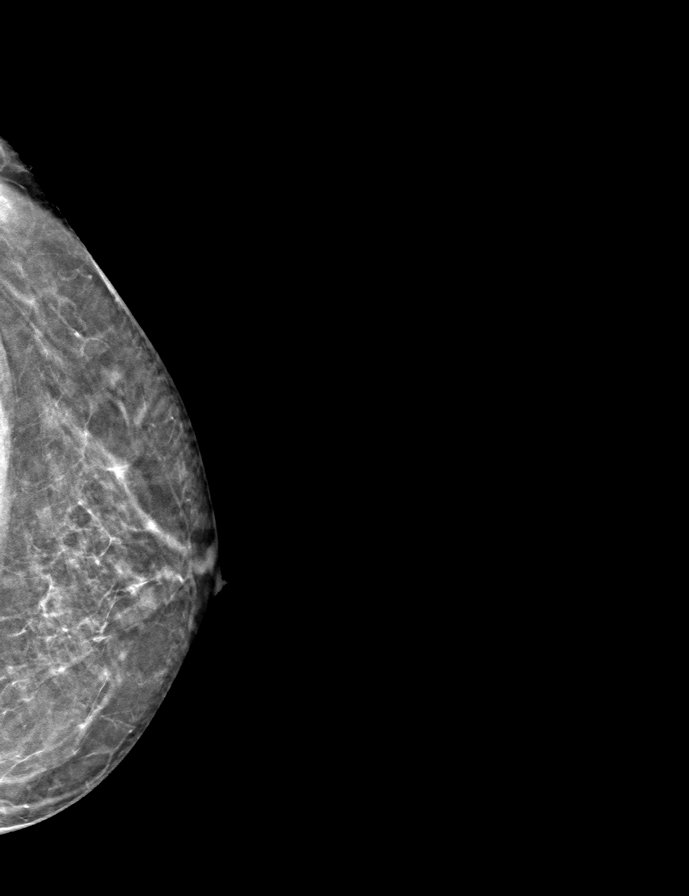

[R CC tomo · tomo slice 27/54.0]
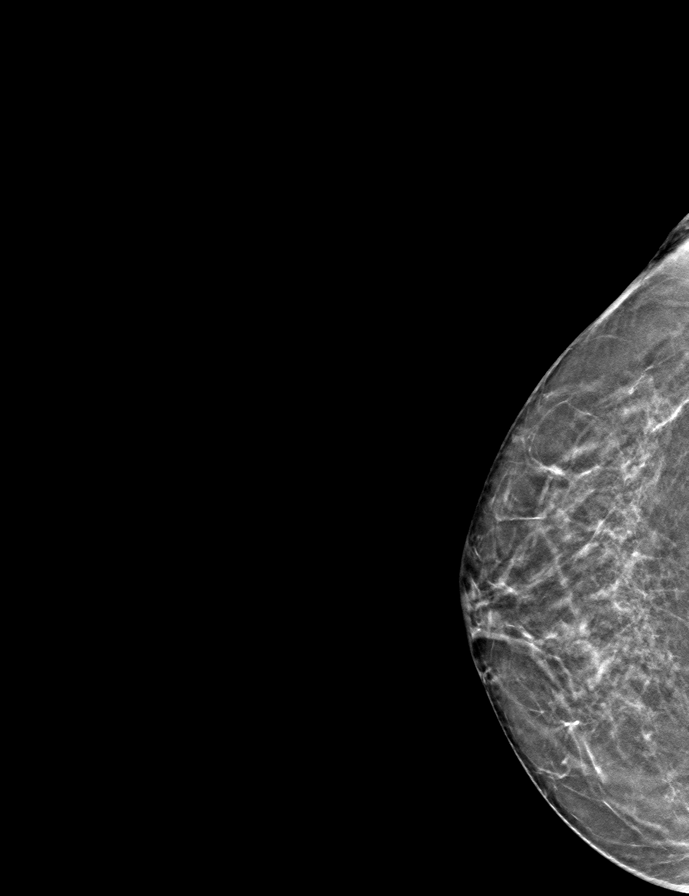

[9 of 24 positions shown; findings below may reference images not displayed]

ACR Breast Density Category b: There are scattered areas of
fibroglandular density.
FINDINGS: There are no findings suspicious for malignancy.
IMPRESSION: No mammographic evidence of malignancy. A result letter of this
screening mammogram will be mailed directly to the patient.

RECOMMENDATION:
Screening mammogram in one year. (Code:51-O-LD2)

BI-RADS CATEGORY  1: Negative.

## 2022-06-21 ENCOUNTER — Ambulatory Visit
Admission: RE | Admit: 2022-06-21 | Discharge: 2022-06-21 | Disposition: A | Discharge status: Home / Self Care | Payer: Medicare Other | Source: Ambulatory Visit | Attending: Family Medicine | Admitting: Family Medicine

## 2022-06-21 DIAGNOSIS — Z1231 Encounter for screening mammogram for malignant neoplasm of breast: Secondary | ICD-10-CM | POA: Insufficient documentation

## 2022-06-27 ENCOUNTER — Other Ambulatory Visit: Payer: Self-pay | Admitting: Family

## 2022-06-27 DIAGNOSIS — R5383 Other fatigue: Secondary | ICD-10-CM | POA: Diagnosis not present

## 2022-06-27 DIAGNOSIS — M5412 Radiculopathy, cervical region: Secondary | ICD-10-CM | POA: Diagnosis not present

## 2022-06-27 DIAGNOSIS — Z136 Encounter for screening for cardiovascular disorders: Secondary | ICD-10-CM | POA: Diagnosis not present

## 2022-06-27 DIAGNOSIS — E785 Hyperlipidemia, unspecified: Secondary | ICD-10-CM | POA: Diagnosis not present

## 2022-06-27 DIAGNOSIS — E782 Mixed hyperlipidemia: Secondary | ICD-10-CM | POA: Diagnosis not present

## 2022-06-27 DIAGNOSIS — E039 Hypothyroidism, unspecified: Secondary | ICD-10-CM | POA: Diagnosis not present

## 2022-06-27 DIAGNOSIS — M5416 Radiculopathy, lumbar region: Secondary | ICD-10-CM | POA: Diagnosis not present

## 2022-06-27 DIAGNOSIS — Z1331 Encounter for screening for depression: Secondary | ICD-10-CM | POA: Diagnosis not present

## 2022-06-27 DIAGNOSIS — Z0001 Encounter for general adult medical examination with abnormal findings: Secondary | ICD-10-CM | POA: Diagnosis not present

## 2022-06-27 DIAGNOSIS — R799 Abnormal finding of blood chemistry, unspecified: Secondary | ICD-10-CM | POA: Diagnosis not present

## 2022-06-27 DIAGNOSIS — E559 Vitamin D deficiency, unspecified: Secondary | ICD-10-CM | POA: Diagnosis not present

## 2022-06-27 DIAGNOSIS — Z7689 Persons encountering health services in other specified circumstances: Secondary | ICD-10-CM

## 2022-06-30 DIAGNOSIS — M5416 Radiculopathy, lumbar region: Secondary | ICD-10-CM | POA: Diagnosis not present

## 2022-06-30 DIAGNOSIS — M5412 Radiculopathy, cervical region: Secondary | ICD-10-CM | POA: Diagnosis not present

## 2022-07-06 DIAGNOSIS — M5416 Radiculopathy, lumbar region: Secondary | ICD-10-CM | POA: Diagnosis not present

## 2022-07-06 DIAGNOSIS — M5412 Radiculopathy, cervical region: Secondary | ICD-10-CM | POA: Diagnosis not present

## 2022-07-12 DIAGNOSIS — T8529XA Other mechanical complication of intraocular lens, initial encounter: Secondary | ICD-10-CM | POA: Diagnosis not present

## 2022-07-12 DIAGNOSIS — Z9842 Cataract extraction status, left eye: Secondary | ICD-10-CM | POA: Diagnosis not present

## 2022-07-31 DIAGNOSIS — Z1212 Encounter for screening for malignant neoplasm of rectum: Secondary | ICD-10-CM | POA: Diagnosis not present

## 2022-07-31 DIAGNOSIS — Z1211 Encounter for screening for malignant neoplasm of colon: Secondary | ICD-10-CM | POA: Diagnosis not present

## 2022-08-29 DIAGNOSIS — M5412 Radiculopathy, cervical region: Secondary | ICD-10-CM | POA: Diagnosis not present

## 2022-10-10 ENCOUNTER — Telehealth: Payer: Medicare Other | Admitting: Physician Assistant

## 2022-10-10 DIAGNOSIS — B9689 Other specified bacterial agents as the cause of diseases classified elsewhere: Secondary | ICD-10-CM | POA: Diagnosis not present

## 2022-10-10 DIAGNOSIS — J019 Acute sinusitis, unspecified: Secondary | ICD-10-CM

## 2022-10-10 MED ORDER — AMOXICILLIN-POT CLAVULANATE 875-125 MG PO TABS: 1.0000 | ORAL_TABLET | Freq: Two times a day (BID) | ORAL | 0 refills | Status: AC

## 2022-10-10 NOTE — Progress Notes (Signed)
E-Visit for Sinus Problems  We are sorry that you are not feeling well.  Here is how we plan to help!  Based on what you have shared with me it looks like you have sinusitis.  Sinusitis is inflammation and infection in the sinus cavities of the head.  Based on your presentation I believe you most likely have Acute Bacterial Sinusitis.  This is an infection caused by bacteria and is treated with antibiotics. I have prescribed Augmentin 875mg/125mg one tablet twice daily with food, for 7 days. You may use an oral decongestant such as Mucinex D or if you have glaucoma or high blood pressure use plain Mucinex. Saline nasal spray help and can safely be used as often as needed for congestion.  If you develop worsening sinus pain, fever or notice severe headache and vision changes, or if symptoms are not better after completion of antibiotic, please schedule an appointment with a health care provider.    Sinus infections are not as easily transmitted as other respiratory infection, however we still recommend that you avoid close contact with loved ones, especially the very young and elderly.  Remember to wash your hands thoroughly throughout the day as this is the number one way to prevent the spread of infection!  Home Care: Only take medications as instructed by your medical team. Complete the entire course of an antibiotic. Do not take these medications with alcohol. A steam or ultrasonic humidifier can help congestion.  You can place a towel over your head and breathe in the steam from hot water coming from a faucet. Avoid close contacts especially the very young and the elderly. Cover your mouth when you cough or sneeze. Always remember to wash your hands.  Get Help Right Away If: You develop worsening fever or sinus pain. You develop a severe head ache or visual changes. Your symptoms persist after you have completed your treatment plan.  Make sure you Understand these instructions. Will watch  your condition. Will get help right away if you are not doing well or get worse.  Thank you for choosing an e-visit.  Your e-visit answers were reviewed by a board certified advanced clinical practitioner to complete your personal care plan. Depending upon the condition, your plan could have included both over the counter or prescription medications.  Please review your pharmacy choice. Make sure the pharmacy is open so you can pick up prescription now. If there is a problem, you may contact your provider through MyChart messaging and have the prescription routed to another pharmacy.  Your safety is important to us. If you have drug allergies check your prescription carefully.   For the next 24 hours you can use MyChart to ask questions about today's visit, request a non-urgent call back, or ask for a work or school excuse. You will get an email in the next two days asking about your experience. I hope that your e-visit has been valuable and will speed your recovery.  I have spent 5 minutes in review of e-visit questionnaire, review and updating patient chart, medical decision making and response to patient.   Jennifer M Burnette, PA-C  

## 2023-08-10 ENCOUNTER — Other Ambulatory Visit: Payer: Self-pay | Admitting: Family Medicine

## 2023-08-10 DIAGNOSIS — Z1231 Encounter for screening mammogram for malignant neoplasm of breast: Secondary | ICD-10-CM

## 2023-08-15 ENCOUNTER — Ambulatory Visit
Admission: RE | Admit: 2023-08-15 | Discharge: 2023-08-15 | Disposition: A | Discharge status: Home / Self Care | Source: Ambulatory Visit | Attending: Family Medicine | Admitting: Family Medicine

## 2023-08-15 DIAGNOSIS — Z1231 Encounter for screening mammogram for malignant neoplasm of breast: Secondary | ICD-10-CM | POA: Insufficient documentation

## 2023-12-22 ENCOUNTER — Telehealth: Admitting: Physician Assistant

## 2023-12-22 DIAGNOSIS — H10029 Other mucopurulent conjunctivitis, unspecified eye: Secondary | ICD-10-CM

## 2023-12-22 NOTE — Progress Notes (Signed)
   Thank you for the details you included in the comment boxes. Those details are very helpful in determining the best course of treatment for you and help [place] to provide the best care.Because of your symptoms, we recommend that you schedule a Virtual Urgent Care video visit in order for the provider to better assess what is going on.  The provider will be able to give you a more accurate diagnosis and treatment plan if we can more freely discuss your symptoms and with the addition of a virtual examination.   If you change your visit to a video visit, we will bill your insurance (similar to an office visit) and you will not be charged for this e-Visit. You will be able to stay at home and speak with the first available [facility] advanced practice provider. The link to do a video visit is in the drop down Menu tab of your Welcome screen in MyChart.
